# Patient Record
Sex: Male | Born: 1965 | Race: White | Hispanic: No | Marital: Single | Smoking: Former smoker
Health system: Southern US, Community
[De-identification: ages and names within clinical notes are randomized; demographics above are authoritative.]

## PROBLEM LIST (undated history)

## (undated) ENCOUNTER — Emergency Department (HOSPITAL_COMMUNITY): Payer: Self-pay

## (undated) DIAGNOSIS — T7840XA Allergy, unspecified, initial encounter: Secondary | ICD-10-CM

## (undated) DIAGNOSIS — K219 Gastro-esophageal reflux disease without esophagitis: Secondary | ICD-10-CM

## (undated) HISTORY — PX: WISDOM TOOTH EXTRACTION: SHX21

## (undated) HISTORY — DX: Gastro-esophageal reflux disease without esophagitis: K21.9

## (undated) HISTORY — DX: Allergy, unspecified, initial encounter: T78.40XA

---

## 2004-04-02 ENCOUNTER — Emergency Department (HOSPITAL_COMMUNITY): Admission: EM | Admit: 2004-04-02 | Discharge: 2004-04-02 | Payer: Self-pay | Admitting: Emergency Medicine

## 2012-04-21 DIAGNOSIS — K219 Gastro-esophageal reflux disease without esophagitis: Secondary | ICD-10-CM

## 2012-04-21 HISTORY — DX: Gastro-esophageal reflux disease without esophagitis: K21.9

## 2012-07-31 ENCOUNTER — Ambulatory Visit (INDEPENDENT_AMBULATORY_CARE_PROVIDER_SITE_OTHER): Payer: BC Managed Care – PPO | Admitting: Family Medicine

## 2012-07-31 VITALS — BP 120/75 | HR 69 | Temp 98.5°F | Resp 18 | Ht 67.0 in | Wt 183.0 lb

## 2012-07-31 DIAGNOSIS — Z113 Encounter for screening for infections with a predominantly sexual mode of transmission: Secondary | ICD-10-CM

## 2012-07-31 DIAGNOSIS — J309 Allergic rhinitis, unspecified: Secondary | ICD-10-CM

## 2012-07-31 DIAGNOSIS — Z09 Encounter for follow-up examination after completed treatment for conditions other than malignant neoplasm: Secondary | ICD-10-CM

## 2012-07-31 DIAGNOSIS — J302 Other seasonal allergic rhinitis: Secondary | ICD-10-CM

## 2012-07-31 DIAGNOSIS — K219 Gastro-esophageal reflux disease without esophagitis: Secondary | ICD-10-CM

## 2012-07-31 DIAGNOSIS — Z0000 Encounter for general adult medical examination without abnormal findings: Secondary | ICD-10-CM

## 2012-07-31 DIAGNOSIS — Z23 Encounter for immunization: Secondary | ICD-10-CM

## 2012-07-31 LAB — COMPREHENSIVE METABOLIC PANEL
ALT: 20 U/L (ref 0–53)
AST: 18 U/L (ref 0–37)
Albumin: 4.3 g/dL (ref 3.5–5.2)
Alkaline Phosphatase: 65 U/L (ref 39–117)
BUN: 17 mg/dL (ref 6–23)
CO2: 29 mEq/L (ref 19–32)
Calcium: 9.6 mg/dL (ref 8.4–10.5)
Chloride: 102 mEq/L (ref 96–112)
Creat: 0.85 mg/dL (ref 0.50–1.35)
Glucose, Bld: 95 mg/dL (ref 70–99)
Potassium: 4.5 mEq/L (ref 3.5–5.3)
Sodium: 139 mEq/L (ref 135–145)
Total Bilirubin: 0.7 mg/dL (ref 0.3–1.2)
Total Protein: 7.4 g/dL (ref 6.0–8.3)

## 2012-07-31 LAB — POCT URINALYSIS DIPSTICK
Bilirubin, UA: NEGATIVE
Blood, UA: NEGATIVE
Glucose, UA: NEGATIVE
Ketones, UA: NEGATIVE
Leukocytes, UA: NEGATIVE
Nitrite, UA: NEGATIVE
Protein, UA: NEGATIVE
Spec Grav, UA: 1.02
Urobilinogen, UA: 0.2
pH, UA: 7

## 2012-07-31 LAB — POCT CBC
Granulocyte percent: 57.7 %G (ref 37–80)
HCT, POC: 42.2 % — AB (ref 43.5–53.7)
Hemoglobin: 13.2 g/dL — AB (ref 14.1–18.1)
Lymph, poc: 1.8 (ref 0.6–3.4)
MCH, POC: 28.8 pg (ref 27–31.2)
MCHC: 31.3 g/dL — AB (ref 31.8–35.4)
MCV: 92.1 fL (ref 80–97)
MID (cbc): 0.4 (ref 0–0.9)
MPV: 8.4 fL (ref 0–99.8)
POC Granulocyte: 3 (ref 2–6.9)
POC LYMPH PERCENT: 34.6 %L (ref 10–50)
POC MID %: 7.7 %M (ref 0–12)
Platelet Count, POC: 311 10*3/uL (ref 142–424)
RBC: 4.58 M/uL — AB (ref 4.69–6.13)
RDW, POC: 14 %
WBC: 5.2 10*3/uL (ref 4.6–10.2)

## 2012-07-31 LAB — LIPID PANEL
Cholesterol: 174 mg/dL (ref 0–200)
HDL: 63 mg/dL (ref >39)
LDL Cholesterol: 98 mg/dL (ref 0–99)
Total CHOL/HDL Ratio: 2.8 Ratio
Triglycerides: 63 mg/dL (ref <150)
VLDL: 13 mg/dL (ref 0–40)

## 2012-07-31 LAB — TSH: TSH: 1.086 u[IU]/mL (ref 0.350–4.500)

## 2012-07-31 LAB — VITAMIN B12: Vitamin B-12: 614 pg/mL (ref 211–911)

## 2012-07-31 LAB — HIV ANTIBODY (ROUTINE TESTING W REFLEX): HIV: NONREACTIVE

## 2012-07-31 LAB — POCT UA - MICROSCOPIC ONLY
Bacteria, U Microscopic: NEGATIVE
Casts, Ur, LPF, POC: NEGATIVE
Crystals, Ur, HPF, POC: NEGATIVE
Mucus, UA: NEGATIVE
RBC, urine, microscopic: NEGATIVE
WBC, Ur, HPF, POC: NEGATIVE
Yeast, UA: NEGATIVE

## 2012-07-31 LAB — POCT GLYCOSYLATED HEMOGLOBIN (HGB A1C): Hemoglobin A1C: 4.9

## 2012-07-31 LAB — PSA: PSA: 1.11 ng/mL (ref <=4.00)

## 2012-07-31 NOTE — Progress Notes (Signed)
Subjective:    Patient ID: Jeremy Cruz, male    DOB: April 26, 1966, 46 y.o.   MRN: [id number]  HPIThis 46 y.o. male presents for CPE.  Last physical 2007.  Colonoscopy never.  TDAP 2007.  S/p Hepatitis B series 2007. No flu vaccines.  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  Dental exam every six months.    1.  Sinus congestion: intermittent issue for past several weeks.  No fever/chills/sweats. No headache.  No sore throat or ear pain.  +nasal congestion; +rhinorrhea; +PND.  +cough intermittent.  Taking Allegra sporadically; using nasal saline sporadically.  Performing Nettie Pot sporadically.    2.  Heartburn: has started taking Prevacid daily for past two months for heartburn in evenings.  No nausea, vomiting, diarrhea, constipation.  Did suffer with constipation on Prilosec.      Review of Systems  Constitutional: Negative for fever, chills, diaphoresis and fatigue.  HENT: Positive for congestion, rhinorrhea and postnasal drip. Negative for hearing loss, ear pain, sneezing, neck pain, neck stiffness and tinnitus.   Eyes: Negative for photophobia, pain, itching and visual disturbance.  Respiratory: Positive for cough. Negative for choking, chest tightness, shortness of breath, wheezing and stridor.   Cardiovascular: Negative for chest pain, palpitations and leg swelling.  Gastrointestinal: Negative for nausea, vomiting, abdominal pain, diarrhea, constipation and blood in stool.  Genitourinary: Negative for dysuria, frequency, flank pain, discharge, penile swelling, genital sores, penile pain and testicular pain.  Musculoskeletal: Negative for myalgias, back pain, joint swelling, arthralgias and gait problem.  Skin: Negative for color change and rash.  Neurological: Negative for seizures, syncope, facial asymmetry, speech difficulty, light-headedness, numbness and headaches.  Hematological: Negative for adenopathy. Does not bruise/bleed easily.  Psychiatric/Behavioral: Negative for suicidal  ideas, behavioral problems, disturbed wake/sleep cycle, self-injury and dysphoric mood. The patient is not nervous/anxious.     Past Medical History  Diagnosis Date  . Allergy   . GERD (gastroesophageal reflux disease) 04/21/2012    No past surgical history on file.  Prior to Admission medications   Medication Sig Start Date End Date Taking? Authorizing Provider  fexofenadine (ALLEGRA) 180 MG tablet Take 180 mg by mouth daily.   Yes Historical Provider, MD  lansoprazole (PREVACID) 15 MG capsule Take 15 mg by mouth daily.   Yes Historical Provider, MD    Allergies  Allergen Reactions  . Asa (Aspirin)   . Nsaids     History   Social History  . Marital Status: Single    Spouse Name: N/A    Number of Children: N/A  . Years of Education: N/A   Occupational History  . Not on file.   Social History Main Topics  . Smoking status: Former Games developer  . Smokeless tobacco: Not on file  . Alcohol Use: 1.2 oz/week    2 Cans of beer per week  . Drug Use: No  . Sexually Active: Yes   Other Topics Concern  . Not on file   Social History Narrative   Marital status: single; +dating seriously same sex partner x 17 years.Children: noneLiving: with same sex partner  X 17 yearsEmployment:  [employer] x 2000.Tobacco:  Former smoker; quit age 8.Alcohol:  Socially Bud Light twice per month 2 beers  Per episode.Drugs: noneExercise:  Sporadic.Sexually active: yes; total 12.  Last STD screening 2007.Seatbelt:  100% of timeGuns:  One unloaded in house.Sunscreen: yes; SPF 30.    Family History  Problem Relation Age of Onset  . Cancer Father 25  Esophageal and Prostate  . Stroke Father   . Alcohol abuse Father        Objective:   Physical Exam  Nursing note and vitals reviewed. Constitutional: He is oriented to person, place, and time. He appears well-developed and well-nourished. No distress.  HENT:  Head: Normocephalic and atraumatic.  Right Ear: External ear  normal.  Left Ear: External ear normal.  Nose: Nose normal.  Mouth/Throat: Oropharynx is clear and moist.  Eyes: Conjunctivae and EOM are normal. Pupils are equal, round, and reactive to light.  Neck: Normal range of motion. Neck supple. No thyromegaly present.  Cardiovascular: Normal rate, regular rhythm, normal heart sounds and intact distal pulses.   No murmur heard. Pulmonary/Chest: Effort normal and breath sounds normal.  Abdominal: Soft. Bowel sounds are normal. He exhibits no distension and no mass. There is no tenderness. There is no rebound and no guarding.  Genitourinary: Penis normal. No penile tenderness.  Musculoskeletal: Normal range of motion. He exhibits no edema and no tenderness.  Lymphadenopathy:    He has no cervical adenopathy.  Neurological: He is alert and oriented to person, place, and time. He has normal reflexes. No cranial nerve deficit. He exhibits normal muscle tone.  Skin: Skin is warm and dry. He is not diaphoretic.  Psychiatric: He has a normal mood and affect. His behavior is normal. Judgment and thought content normal.   Results for orders placed in visit on 07/31/12  POCT UA - MICROSCOPIC ONLY      Component Value Range   WBC, Ur, HPF, POC neg     RBC, urine, microscopic neg     Bacteria, U Microscopic neg     Mucus, UA neg     Epithelial cells, urine per micros 0-2     Crystals, Ur, HPF, POC neg     Casts, Ur, LPF, POC neg     Yeast, UA neg    POCT URINALYSIS DIPSTICK      Component Value Range   Color, UA yellow     Clarity, UA clear     Glucose, UA neg     Bilirubin, UA neg     Ketones, UA neg     Spec Grav, UA 1.020     Blood, UA neg     pH, UA 7.0     Protein, UA neg     Urobilinogen, UA 0.2     Nitrite, UA neg     Leukocytes, UA Negative    POCT CBC      Component Value Range   WBC 5.2  4.6 - 10.2 K/uL   Lymph, poc 1.8  0.6 - 3.4   POC LYMPH PERCENT 34.6  10 - 50 %L   MID (cbc) 0.4  0 - 0.9   POC MID % 7.7  0 - 12 %M   POC  Granulocyte 3.0  2 - 6.9   Granulocyte percent 57.7  37 - 80 %G   RBC 4.58 (*) 4.69 - 6.13 M/uL   Hemoglobin 13.2 (*) 14.1 - 18.1 g/dL   HCT, POC 16.1 (*) 09.6 - 53.7 %   MCV 92.1  80 - 97 fL   MCH, POC 28.8  27 - 31.2 pg   MCHC 31.3 (*) 31.8 - 35.4 g/dL   RDW, POC 04.5     Platelet Count, POC 311  142 - 424 K/uL   MPV 8.4  0 - 99.8 fL  POCT GLYCOSYLATED HEMOGLOBIN (HGB A1C)  Component Value Range   Hemoglobin A1C 4.9         Assessment & Plan:   1. Routine general medical examination at a health care facility  HIV antibody, GC/chlamydia probe amp, urine, RPR, POCT UA - Microscopic Only, POCT urinalysis dipstick, POCT CBC, POCT glycosylated hemoglobin (Hb A1C), Vitamin B12, Lipid panel, PSA, Comprehensive metabolic panel, Vitamin D, 25-hydroxy, TSH, Hepatitis A vaccine adult IM  2. Screen for STD (sexually transmitted disease)  HIV antibody, GC/chlamydia probe amp, urine, RPR, POCT UA - Microscopic Only, POCT urinalysis dipstick, Hepatitis A vaccine adult IM  3. Seasonal allergies  POCT CBC  4. GERD (gastroesophageal reflux disease)    5.  S/p Hepatitis A vaccine  1.  CPE: anticipatory guidance --- safe sex practices, seatbelt use, sunscreen use.  S/p Hepatitis A#1 per current guidelines for males with same sexual partners; RTC six months for Hepatitis A #2.  TDAP and Hepatitis B series UTD; declined flu vaccine.  Obtain labs. 2.  STD screening:  Counseled extensively during visit regarding HIV screening; obtain uriprobe, RPR, HIV. 3.  Allergic Rhinitis: worsening; restart daily Allegra 180mg  daily; continue daily nasal saline q am.  May warrant addition of nasal steroid spray if symptoms persist. 4.  GERD: New.  Continue Prevacid daily; recommend dietary modification.   5. Hepatitis A#1:  RTC six months for #2.

## 2012-07-31 NOTE — Patient Instructions (Addendum)
1. Routine general medical examination at a health care facility  HIV antibody, GC/chlamydia probe amp, urine, RPR, POCT UA - Microscopic Only, POCT urinalysis dipstick, POCT CBC, POCT glycosylated hemoglobin (Hb A1C), Vitamin B12, Lipid panel, PSA, Comprehensive metabolic panel, Vitamin D, 25-hydroxy, TSH  2. Screen for STD (sexually transmitted disease)  HIV antibody, GC/chlamydia probe amp, urine, RPR, POCT UA - Microscopic Only, POCT urinalysis dipstick  3. Seasonal allergies  POCT CBC  4. GERD (gastroesophageal reflux disease)

## 2012-08-01 LAB — VITAMIN D 25 HYDROXY (VIT D DEFICIENCY, FRACTURES): Vit D, 25-Hydroxy: 48 ng/mL (ref 30–89)

## 2012-08-01 LAB — GC/CHLAMYDIA PROBE AMP, URINE
Chlamydia, Swab/Urine, PCR: NEGATIVE
GC Probe Amp, Urine: NEGATIVE

## 2012-08-01 LAB — RPR

## 2012-08-01 NOTE — Progress Notes (Signed)
Reviewed and agree.

## 2012-08-02 NOTE — Progress Notes (Signed)
Appt scheduled with pt for 2nd Hep A 01/28/13. Jeremy Cruz

## 2012-11-19 ENCOUNTER — Ambulatory Visit (INDEPENDENT_AMBULATORY_CARE_PROVIDER_SITE_OTHER): Payer: BC Managed Care – PPO | Admitting: Physician Assistant

## 2012-11-19 VITALS — BP 136/75 | HR 89 | Temp 97.6°F | Resp 16 | Ht 68.0 in | Wt 190.2 lb

## 2012-11-19 DIAGNOSIS — M62838 Other muscle spasm: Secondary | ICD-10-CM

## 2012-11-19 MED ORDER — CYCLOBENZAPRINE HCL 5 MG PO TABS: 5.0000 mg | ORAL_TABLET | Freq: Three times a day (TID) | ORAL | Status: DC | PRN
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
## 2013-01-28 ENCOUNTER — Ambulatory Visit: Payer: BC Managed Care – PPO | Admitting: Family Medicine

## 2013-02-04 ENCOUNTER — Ambulatory Visit (INDEPENDENT_AMBULATORY_CARE_PROVIDER_SITE_OTHER): Payer: BC Managed Care – PPO | Admitting: *Deleted

## 2013-02-04 DIAGNOSIS — Z23 Encounter for immunization: Secondary | ICD-10-CM

## 2014-12-29 ENCOUNTER — Ambulatory Visit (INDEPENDENT_AMBULATORY_CARE_PROVIDER_SITE_OTHER): Payer: BLUE CROSS/BLUE SHIELD | Admitting: Family Medicine

## 2014-12-29 VITALS — BP 122/74 | HR 81 | Temp 98.0°F | Resp 17 | Ht 68.0 in | Wt 195.0 lb

## 2014-12-29 DIAGNOSIS — R439 Unspecified disturbances of smell and taste: Secondary | ICD-10-CM

## 2014-12-29 DIAGNOSIS — R431 Parosmia: Secondary | ICD-10-CM

## 2014-12-29 DIAGNOSIS — H6982 Other specified disorders of Eustachian tube, left ear: Secondary | ICD-10-CM

## 2014-12-29 DIAGNOSIS — K219 Gastro-esophageal reflux disease without esophagitis: Secondary | ICD-10-CM

## 2014-12-29 MED ORDER — FLUTICASONE PROPIONATE 50 MCG/ACT NA SUSP: 2.0000 | Freq: Every day | NASAL | Status: DC

## 2014-12-29 MED ORDER — PREDNISONE 20 MG PO TABS: ORAL_TABLET | ORAL | Status: DC

## 2014-12-29 NOTE — Patient Instructions (Addendum)
I will get you set up to see GI- they may want to do an endoscopy to check your stomach and esophagus.  In the meantime I would continue taking the nexium I suspect that your ear pain and taste changes may be related and have to do with your nose.  Try the flonase nasal spray daily.  This will take a few days to start working-in the meantime we will try prednisone by mouth for 5 days If your taste sense does not seem to come back to normal let me know
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
## 2014-12-30 LAB — COMPREHENSIVE METABOLIC PANEL
ALT: 22 U/L (ref 0–53)
AST: 19 U/L (ref 0–37)
Albumin: 4.5 g/dL (ref 3.5–5.2)
Alkaline Phosphatase: 68 U/L (ref 39–117)
BUN: 12 mg/dL (ref 6–23)
CO2: 27 mEq/L (ref 19–32)
Calcium: 9.6 mg/dL (ref 8.4–10.5)
Chloride: 101 mEq/L (ref 96–112)
Creat: 0.94 mg/dL (ref 0.50–1.35)
Glucose, Bld: 87 mg/dL (ref 70–99)
Potassium: 4.1 mEq/L (ref 3.5–5.3)
Sodium: 140 mEq/L (ref 135–145)
Total Bilirubin: 0.7 mg/dL (ref 0.2–1.2)
Total Protein: 7.3 g/dL (ref 6.0–8.3)

## 2014-12-30 LAB — CBC
HCT: 44.2 % (ref 39.0–52.0)
Hemoglobin: 15.1 g/dL (ref 13.0–17.0)
MCH: 30.4 pg (ref 26.0–34.0)
MCHC: 34.2 g/dL (ref 30.0–36.0)
MCV: 88.9 fL (ref 78.0–100.0)
MPV: 9.9 fL (ref 8.6–12.4)
Platelets: 322 10*3/uL (ref 150–400)
RBC: 4.97 MIL/uL (ref 4.22–5.81)
RDW: 13.8 % (ref 11.5–15.5)
WBC: 6.6 10*3/uL (ref 4.0–10.5)

## 2014-12-31 ENCOUNTER — Encounter: Payer: Self-pay | Admitting: Family Medicine

## 2016-09-19 ENCOUNTER — Ambulatory Visit (INDEPENDENT_AMBULATORY_CARE_PROVIDER_SITE_OTHER): Payer: Worker's Compensation | Admitting: Physician Assistant

## 2016-09-19 ENCOUNTER — Ambulatory Visit (INDEPENDENT_AMBULATORY_CARE_PROVIDER_SITE_OTHER): Payer: Worker's Compensation

## 2016-09-19 VITALS — BP 128/80 | HR 80 | Temp 97.9°F | Resp 17 | Ht 68.0 in | Wt 189.0 lb

## 2016-09-19 DIAGNOSIS — S61431A Puncture wound without foreign body of right hand, initial encounter: Secondary | ICD-10-CM | POA: Diagnosis not present

## 2016-09-19 DIAGNOSIS — Z23 Encounter for immunization: Secondary | ICD-10-CM | POA: Diagnosis not present

## 2016-09-19 MED ORDER — CEPHALEXIN 250 MG PO CAPS: 500.0000 mg | ORAL_CAPSULE | Freq: Four times a day (QID) | ORAL | 0 refills | Status: DC

## 2016-09-19 NOTE — Progress Notes (Signed)
Norman HerrlichSteven Heal 02-22-1966 50 y.o.   Chief Complaint  Patient presents with  . Hand Injury    Right. Bamboo went into hand upon fall      Date of Injury: 09/19/2016, approximately 5 pm  History of Present Illness:  Presents for evaluation of work-related complaint. At work just prior to arrival, he slipped and landed with his outstretched hand on some plant materials (reed-like), and one punctured the palm of the RIGHT hand.  He is RIGHT hand dominant. Unknown last tetanus vaccine.  He estimates that the reed went in his hand approximately 0.5 cm.   Review of Systems  Constitutional: Negative for chills and fever.  Eyes: Negative for blurred vision, double vision and photophobia.  Respiratory: Negative for cough and shortness of breath.   Cardiovascular: Negative for chest pain and palpitations.  Gastrointestinal: Positive for nausea (assocaited with the pain).  Skin: Negative for itching and rash.  Neurological: Positive for dizziness (associated with the pain) and tingling (in the RIGHT index finger since the injury). Negative for sensory change, focal weakness and headaches.       Current medications and allergies reviewed and updated. Past medical history, family history, social history have been reviewed and updated.   Physical Exam  Constitutional: He is oriented to person, place, and time and well-developed, well-nourished, and in no distress.  BP 128/80 (BP Location: Left Arm, Patient Position: Sitting, Cuff Size: Large)   Pulse 80   Temp 97.9 F (36.6 C) (Oral)   Resp 17   Ht 5\' 8"  (1.727 m)   Wt 189 lb (85.7 kg)   SpO2 98%   BMI 28.74 kg/m    Eyes: Conjunctivae are normal.  Pulmonary/Chest: Effort normal.  Musculoskeletal:       Right hand: He exhibits tenderness (at the wound) and laceration. He exhibits normal range of motion, no bony tenderness, normal two-point discrimination, normal capillary refill, no deformity and no swelling. Normal  sensation noted. Normal strength noted.       Hands: Neurological: He is alert and oriented to person, place, and time. Gait normal.  Skin: Skin is warm and dry.  Psychiatric: Memory, affect and judgment normal. His mood appears anxious. He does not exhibit a depressed mood. He expresses no homicidal and no suicidal ideation. He expresses no suicidal plans and no homicidal plans.     Dg Hand Complete Right  Result Date: 09/19/2016 CLINICAL DATA:  Puncture wound to the right hand by plant stalk. Assess for foreign body. Initial encounter. EXAM: RIGHT HAND - COMPLETE 3+ VIEW COMPARISON:  None. FINDINGS: There is no evidence of fracture or dislocation. The joint spaces are preserved. The carpal rows are intact, and demonstrate normal alignment. The known puncture wound is not well characterized. No radiopaque foreign bodies are seen. Note that a plant stalk is unlikely to be visible on radiograph. Mild ulnar soft tissue swelling is noted. IMPRESSION: No evidence of fracture or dislocation. No radiopaque foreign bodies seen. Electronically Signed   By: Roanna RaiderJeffery  Chang M.D.   On: 09/19/2016 18:25     Assessment and Plan:  1. Puncture wound of right hand, foreign body presence unspecified, initial encounter Cover for infection given mechanism of wound, and not sutured. Local wound care. Minimal use RIGHT hand. Re-evaluate in 1 week. - DG Hand Complete Right; Future - cephALEXin (KEFLEX) 250 MG capsule; Take 2 capsules (500 mg total) by mouth 4 (four) times daily.  Dispense: 28 capsule; Refill: 0  2. Need for Tdap vaccination -  Tdap vaccine greater than or equal to 7yo IM   Fernande Brashelle S. Jonavin Seder, PA-C Physician Assistant-Certified [facility]

## 2016-09-19 NOTE — Patient Instructions (Addendum)
Wash the wound at least once each day with soap and water. Keep it covered while you are working until healed. If you have any persistent symptoms, return for re-evaluation.    IF you received an x-ray today, you will receive an invoice from [facility]. Please contact [facility] at [phone] with questions or concerns regarding your invoice.   IF you received labwork today, you will receive an invoice from [facility]. Please contact [facility] at [phone] with questions or concerns regarding your invoice.   Our billing staff will not be able to assist you with questions regarding bills from these companies.  You will be contacted with the lab results as soon as they are available. The fastest way to get your results is to activate your My Chart account. Instructions are located on the last page of this paperwork. If you have not heard from us regarding the results in 2 weeks, please contact this office.

## 2016-09-19 NOTE — Progress Notes (Signed)
Jeremy HerrlichSteven Cruz 08-26-1966 50 y.o.       Chief Complaint  Patient presents with  . Hand Injury    Right. Bamboo went into hand upon fall      Date of Injury: 09/19/2016, approximately 5 pm  History of Present Illness:  Presents for evaluation of work-related complaint. 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 States he believes about ~1cm (he noted a fingernail's length) of the plant went through his hand in the center of his palm. Notes immediate pain and bleeding at the site and says he has had some associated tingling and a numbness sensation in his right index finger. States he pull the plant stalk out immediately. Presents with hand in a fist over injury site. Denies loss of motor function of fingers. Notes some nausea and lightheadedness from looking at the injury site but denies vomiting.    ROS Pertinent ROS mentioned above in HPI.  Current medications and allergies reviewed and updated. Past medical history, family history, social history have been reviewed and updated.   Physical Exam  Musculoskeletal:       Right wrist: He exhibits normal range of motion, no tenderness, no bony tenderness, no swelling, no effusion, no crepitus and no deformity.       Left wrist: He exhibits normal range of motion, no tenderness, no bony tenderness, no swelling, no effusion, no crepitus and no deformity.       Right hand: He exhibits no tenderness, normal capillary refill, no deformity and no swelling. Normal sensation noted. Decreased sensation is not present in the ulnar distribution, is not present in the medial distribution and is not present in the radial distribution.       Left hand: He exhibits normal range of motion, no tenderness, normal capillary refill, no deformity, no laceration and no swelling. Normal sensation noted. Normal strength noted.       Hands: Decreased active ROM in right hand due to  pain from puncture wound. Able to perform passive ROM of all fingers and sensation present.     Dg Hand Complete Right  Result Date: 09/19/2016 CLINICAL DATA:  Puncture wound to the right hand by plant stalk. Assess for foreign body. Initial encounter. EXAM: RIGHT HAND - COMPLETE 3+ VIEW COMPARISON:  None. FINDINGS: There is no evidence of fracture or dislocation. The joint spaces are preserved. The carpal rows are intact, and demonstrate normal alignment. The known puncture wound is not well characterized. No radiopaque foreign bodies are seen. Note that a plant stalk is unlikely to be visible on radiograph. Mild ulnar soft tissue swelling is noted. IMPRESSION: No evidence of fracture or dislocation. No radiopaque foreign bodies seen. Electronically Signed   By: Roanna RaiderJeffery  Chang M.D.   On: 09/19/2016 18:25   1% Lidocaine HCl administered to puncture wound site. Wound cleansed with warm soapy water and appropriately dressed and bandaged with coban wrap.   Assessment and Plan: 1. Puncture wound of right hand, foreign body presence unspecified, initial encounter Right hand x-ray with no evidence of fracture or dislocation, no foreign bodies seen. Wound site cleansed and dressed in clinic. Tdap administered. Advised patient to wash the wound daily with soap and water and keep it covered while it is healing. Rx Keflex to prevent bacterial infection from puncture wound. Instructed to RTC if persistent symptoms. - DG Hand Complete Right; Future - cephALEXin (KEFLEX) 250 MG capsule; Take 2 capsules (500 mg total) by mouth  4 (four) times daily.  Dispense: 28 capsule; Refill: 0  2. Need for Tdap vaccination Overdue for Tdap vaccine and puncture wound today. Tdap given in clinic.  - Tdap vaccine greater than or equal to 7yo IM

## 2016-09-26 ENCOUNTER — Ambulatory Visit (INDEPENDENT_AMBULATORY_CARE_PROVIDER_SITE_OTHER): Payer: Worker's Compensation | Admitting: Physician Assistant

## 2016-09-26 VITALS — BP 110/80 | HR 76 | Temp 97.5°F | Ht 68.0 in | Wt 188.0 lb

## 2016-09-26 DIAGNOSIS — S61431A Puncture wound without foreign body of right hand, initial encounter: Secondary | ICD-10-CM

## 2016-09-26 DIAGNOSIS — R202 Paresthesia of skin: Secondary | ICD-10-CM | POA: Diagnosis not present

## 2016-09-26 DIAGNOSIS — L03113 Cellulitis of right upper limb: Secondary | ICD-10-CM

## 2016-09-26 DIAGNOSIS — S61431D Puncture wound without foreign body of right hand, subsequent encounter: Secondary | ICD-10-CM | POA: Diagnosis not present

## 2016-09-26 MED ORDER — DOXYCYCLINE HYCLATE 100 MG PO CAPS: 100.0000 mg | ORAL_CAPSULE | Freq: Two times a day (BID) | ORAL | 0 refills | Status: AC

## 2016-09-26 MED ORDER — DOXYCYCLINE HYCLATE 100 MG PO CAPS: 100.0000 mg | ORAL_CAPSULE | Freq: Two times a day (BID) | ORAL | 0 refills | Status: DC

## 2016-09-26 NOTE — Patient Instructions (Addendum)
Continue washing the wound daily with soap and water. Start the new antibiotic. Apply a triple antibiotic ointment (Neosporin, polysporin) and apply a bandaid when you are doing anything where it could become contaminated. Wash at least daily with soap and water.     IF you received an x-ray today, you will receive an invoice from [facility]. Please contact [facility] at [phone] with questions or concerns regarding your invoice.   IF you received labwork today, you will receive an invoice from [facility]. Please contact [facility] at [phone] with questions or concerns regarding your invoice.   Our billing staff will not be able to assist you with questions regarding bills from these companies.  You will be contacted with the lab results as soon as they are available. The fastest way to get your results is to activate your My Chart account. Instructions are located on the last page of this paperwork. If you have not heard from us regarding the results in 2 weeks, please contact this office.

## 2016-09-26 NOTE — Progress Notes (Signed)
Norman HerrlichSteven Turck Apr 13, 1966 50 y.o.   Chief Complaint  Patient presents with  . Follow-up    rt. hand wound      Date of Injury: 09/19/2016  History of Present Illness:  Presents for evaluation of work-related complaint.  Seen initially on 09/19/2016 following an injury to the RIGHT hand when he accidentally fell at work, landing on his outstretched hand in some reeds. A plant stalk punctured the palm of the RIGHT hand. He reported some tingling in the hand and fingers, thought likely due to swelling and that he had kept his hand in a tight fist since the injury. The wound area was anesthetized and scrubbed with soap and water, rinsed and explored. No FB was seen in the wound, and radiographs were negative. The wound was not sutured, given the puncture nature, and he was started on cephalexin prophylactically. Tdap administered, as he was overdue for immunization.  He has not yet returned to work, due to pain. He has been applying A&D ointment to the wound and relates that a piece of the plant material ("husk of the outer layer") came out of the wound and the he feels like there is more there.  The numbness/tingling sensation is worse, now extending up the arm. He has increased swelling of the hand, and had lost some ROM. Increased redness. Sensitivity to heat initially, but that has resolved.       No drainage, red streaking, fever, chills. Some nausea, no vomiting. Tolerating the cephalexin.   ROS  As above.   Current medications and allergies reviewed and updated. Past medical history, family history, social history have been reviewed and updated.   Physical Exam  Constitutional: He is oriented to person, place, and time and well-developed, well-nourished, and in no distress. No distress.  BP 110/80 (BP Location: Left Arm, Patient Position: Sitting, Cuff Size: Normal)   Pulse 76   Temp 97.5 F (36.4 C) (Oral)   Ht 5\' 8"  (1.727 m)   Wt 188 lb (85.3 kg)   SpO2 99%   BMI  28.59 kg/m    Eyes: Conjunctivae are normal.  Pulmonary/Chest: Effort normal.  Musculoskeletal:       Right hand: He exhibits decreased range of motion, tenderness, laceration and swelling. He exhibits no bony tenderness and normal capillary refill. Decreased sensation: "tingling" Decreased strength (due to pain) noted.       Hands: Neurological: He is alert and oriented to person, place, and time. Gait normal.  Skin: Skin is warm and dry. He is not diaphoretic.  Psychiatric: Mood, memory, affect and judgment normal.     Assessment and Plan: 1. Puncture wound of right hand, foreign body presence unspecified, initial encounter 2. Right hand paresthesia 3. Cellulitis of right upper extremity Add doxycycline. Wash daily with soap and water. He may RTW, but no use RIGHT hand. Cover wound at work. Refer to Hand Surgery, may need exploration for possible retained FB. - Ambulatory referral to Hand Surgery - doxycycline (VIBRAMYCIN) 100 MG capsule; Take 1 capsule (100 mg total) by mouth 2 (two) times daily.  Dispense: 20 capsule; Refill: 0    Fernande Brashelle S. Reynoldo Mainer, PA-C Physician Assistant-Certified [facility]

## 2016-09-26 NOTE — Progress Notes (Signed)
Subjective:    Patient ID: Jeremy Cruz, male    DOB: Jan 24, 1966, 50 y.o.   MRN: [id number]   Chief Complaint  Patient presents with  . Follow-up    rt. hand wound     HPI: Patient presents for follow-up of puncture wound injury which occurred 1 week ago. 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 [facility] and wound was cleansed and x-ray performed which did not show evidence of radioopaque foreign body. Patient states he has not returned to work since the injury. Notes that he put A&D ointment on the site and that the day after the injury some more of the "husk of the outer layer of the plant" worked its way out of the wound site. States his right index finger is still "numb" and tingling and he has had continued radiation of numbness and tingling up his right arm. Also notes decreased ROM in right hand and being unable to do certain tasks such as putting his thumb to his other fingers or make a fist with his hand. He states he has has continued swelling and erythema surrounding the wound but no drainage at the puncture wound area. Notes the swelling and erythema have increased some the past few days which have contributed to his decreased ROM. Notes some sensitivity to heat or "feeling like my arm was burning when hot water touched it" the day after the injury but has not had that same sensation since then. Notes some associated nausea with his symptoms but no fevers, chills, or vomiting.  Allergies  Allergen Reactions  . Asa [Aspirin] Anaphylaxis  . Nsaids Anaphylaxis   Prior to Admission medications   Medication Sig Start Date End Date Taking? Authorizing Provider  fexofenadine (ALLEGRA) 180 MG tablet Take 180 mg by mouth daily.   Yes Historical Provider, MD  lansoprazole (PREVACID) 15 MG capsule Take 15 mg by mouth daily.   Yes Historical Provider, MD  doxycycline (VIBRAMYCIN) 100 MG capsule Take 1  capsule (100 mg total) by mouth 2 (two) times daily. 09/26/16 10/06/16  Porfirio Oarhelle Jeffery, PA-C   There are no active problems to display for this patient.   Review of Systems Pertinent ROS mentioned above in HPI.     Objective:   Physical Exam  Constitutional: He is oriented to person, place, and time. He appears well-developed and well-nourished.  HENT:  Head: Normocephalic and atraumatic.  Cardiovascular:  Pulses:      Radial pulses are 2+ on the right side, and 2+ on the left side.  Musculoskeletal:       Right elbow: He exhibits normal range of motion, no swelling, no deformity and no laceration. No tenderness found.       Left elbow: He exhibits normal range of motion, no swelling, no deformity and no laceration. No tenderness found.       Right wrist: He exhibits normal range of motion, no tenderness, no bony tenderness, no swelling and no deformity.       Left wrist: He exhibits normal range of motion, no tenderness, no bony tenderness, no swelling and no deformity.       Right hand: He exhibits decreased range of motion, tenderness, laceration and swelling. He exhibits no bony tenderness, normal capillary refill and no deformity. Normal sensation noted. Decreased sensation is not present in the ulnar distribution, is not present in the medial distribution and is not present  in the radial distribution. He exhibits no finger abduction, no thumb/finger opposition and no wrist extension trouble.       Left hand: He exhibits normal range of motion, no tenderness, no bony tenderness, normal capillary refill, no deformity, no laceration and no swelling. Normal sensation noted. Normal strength noted.       Hands: Neurological: He is alert and oriented to person, place, and time.  Reflex Scores:      Bicep reflexes are 2+ on the right side and 2+ on the left side. Psychiatric: He has a normal mood and affect. His behavior is normal.          Assessment & Plan:  1. Puncture wound of  right hand, foreign body presence unspecified, initial encounter Continued pain, paresthesias, swelling, and erythema in hand warrants change of ABX to Doxycycline 100 mg BID x 10 days and referral to hand surgery. - Ambulatory referral to Hand Surgery - doxycycline (VIBRAMYCIN) 100 MG capsule; Take 1 capsule (100 mg total) by mouth 2 (two) times daily.  Dispense: 20 capsule; Refill: 0  2. Right hand paresthesia Referral to hand surgery.  3. Cellulitis of right upper extremity Previously on Keflex but hand still appears infected, switched ABX to Doxycycline. See above.

## 2016-09-29 ENCOUNTER — Ambulatory Visit (INDEPENDENT_AMBULATORY_CARE_PROVIDER_SITE_OTHER): Payer: Worker's Compensation | Admitting: Orthopaedic Surgery

## 2016-09-29 ENCOUNTER — Encounter (INDEPENDENT_AMBULATORY_CARE_PROVIDER_SITE_OTHER): Payer: Self-pay | Admitting: Orthopaedic Surgery

## 2016-09-29 DIAGNOSIS — S61411A Laceration without foreign body of right hand, initial encounter: Secondary | ICD-10-CM

## 2016-09-29 NOTE — Progress Notes (Signed)
Office Visit Note   Patient: Jeremy HerrlichSteven Cruz           Date of Birth: 03-Mar-1966           MRN: [id number] Visit Date: 09/29/2016              Requested by: Porfirio Oarhelle Jeffery, PA-C [address] [place], [place] [address] PCP: Nilda SimmerSMITH,KRISTI, MD   Assessment & Plan: Visit Diagnoses: No diagnosis found.  Plan: I discussed with the patient that the hand exam appears benign. He is experiencing expected swelling and scar tissue from the glabrous skin. He does use his hand quite a bit at work and therefore I think he should do light duty for 3 more weeks. I'll plan on seeing her back for recheck in 3 weeks. Questions encouraged and answered.  Follow-Up Instructions: Return in about 3 weeks (around 10/20/2016) for recheck right hand.   Orders:  No orders of the defined types were placed in this encounter.  No orders of the defined types were placed in this encounter.     Procedures: No procedures performed   Clinical Data: No additional findings.   Subjective: Chief Complaint  Patient presents with  . Right Hand - Pain, Injury    DOI: 09/19/16    Patient is a 50 year old gentleman who sustained a traumatic puncture of his right thenar eminence at work about 2 weeks ago. A bamboo shoe was dramatically inserted into his right hand. He did go to the urgent care and x-rays were negative. He endorses paresthesias in his right index finger. He feels a bump where the traumatic laceration occurred. This is to his dominant hand. He's been soaking it in Epsom salt. He did finish a course of doxycycline. He denies any fevers or drainage or chills. The pain does not radiate. The pain overall has gotten better. He does have a sensation of foreign body.      Review of Systems  Constitutional: Negative.   HENT: Negative.   Eyes: Negative.   Respiratory: Negative.   Cardiovascular: Negative.   Gastrointestinal: Negative.   Endocrine: Negative.   Genitourinary: Negative.   Musculoskeletal:  Negative.   Skin: Negative.   Allergic/Immunologic: Negative.   Neurological: Negative.   Hematological: Negative.   Psychiatric/Behavioral: Negative.      Objective: Vital Signs: There were no vitals taken for this visit.  Physical Exam  Constitutional: He is oriented to person, place, and time. He appears well-developed and well-nourished.  HENT:  Head: Normocephalic and atraumatic.  Eyes: EOM are normal.  Neck: Neck supple.  Cardiovascular: Intact distal pulses.   Pulmonary/Chest: Effort normal.  Abdominal: Soft.  Musculoskeletal:       Left knee: He exhibits no effusion.  Neurological: He is alert and oriented to person, place, and time.  Skin: Skin is warm.  Psychiatric: He has a normal mood and affect. His behavior is normal. Judgment and thought content normal.  Nursing note and vitals reviewed.   Left Knee Exam   Other  Effusion: no effusion present   Right Hand Exam   Comments:  His flexor and extensor functions are all intact. There is no signs of infection. He does have swelling in the traumatic region this is tender to palpation. I do not appreciate any evidence of retained foreign body. The traumatic wound has healed up nicely. There is no drainage. He is neurovascularly intact to the thumb and the rest of the hand.      Specialty Comments:  No specialty comments  available.  Imaging: No results found. X-rays of the hand were independently reviewed and interpreted as negative for foreign body and fracture.  PMFS History: There are no active problems to display for this patient.  Past Medical History:  Diagnosis Date  . Allergy   . GERD (gastroesophageal reflux disease) 04/21/2012    Family History  Problem Relation Age of Onset  . Cancer Father 6664    Esophageal and Prostate  . Stroke Father   . Alcohol abuse Father     History reviewed. No pertinent surgical history. Social History   Occupational History  . Not on file.   Social History  Main Topics  . Smoking status: Former Games developermoker  . Smokeless tobacco: Never Used  . Alcohol use 1.2 oz/week    2 Cans of beer per week  . Drug use: No  . Sexual activity: Yes

## 2016-10-10 ENCOUNTER — Encounter (INDEPENDENT_AMBULATORY_CARE_PROVIDER_SITE_OTHER): Payer: Self-pay | Admitting: Orthopaedic Surgery

## 2016-10-10 ENCOUNTER — Ambulatory Visit (INDEPENDENT_AMBULATORY_CARE_PROVIDER_SITE_OTHER): Payer: Worker's Compensation | Admitting: Orthopaedic Surgery

## 2016-10-10 DIAGNOSIS — S61411A Laceration without foreign body of right hand, initial encounter: Secondary | ICD-10-CM

## 2016-10-10 NOTE — Progress Notes (Signed)
Office Visit Note   Patient: Jeremy HerrlichSteven Rao           Date of Birth: 1966-08-10           MRN: [id number] Visit Date: 10/10/2016              Requested by: Ethelda ChickKristi M Smith, MD [address] [place], [place] [address] PCP: Nilda SimmerSMITH,KRISTI, MD   Assessment & Plan: Visit Diagnoses:  1. Laceration without foreign body of right hand, initial encounter     Plan: In terms of the traumatic laceration he's doing quite well. I think that he may have injured the common digital nerve to the index finger given his presentation. At this point I would like to refer him to [facility] for further treatment. Total face to face encounter time was greater than 25 minutes and over half of this time was spent in counseling and/or coordination of care.  Follow-Up Instructions: Return if symptoms worsen or fail to improve.   Orders:  Orders Placed This Encounter  Procedures  . Ambulatory referral to Orthopedic Surgery   No orders of the defined types were placed in this encounter.     Procedures: No procedures performed   Clinical Data: No additional findings.   Subjective: Chief Complaint  Patient presents with  . Right Hand - Pain    Patient follows up today for his right hand. He is doing much better. He states that he was able to pull out 3 bamboo splinters from the hand. He's able to move his hands much better. He still endorses dense numbness on the ulnar aspect of his index finger. Denies any fevers or chills.    Review of Systems  Constitutional: Negative.   HENT: Negative.   Eyes: Negative.   Respiratory: Negative.   Cardiovascular: Negative.   Gastrointestinal: Negative.   Endocrine: Negative.   Genitourinary: Negative.   Musculoskeletal: Negative.   Skin: Negative.   Allergic/Immunologic: Negative.   Neurological: Negative.   Hematological: Negative.   Psychiatric/Behavioral: Negative.      Objective: Vital Signs: There were no vitals  taken for this visit.  Physical Exam  Constitutional: He is oriented to person, place, and time. He appears well-developed and well-nourished.  HENT:  Head: Normocephalic and atraumatic.  Eyes: EOM are normal.  Neck: Neck supple.  Cardiovascular: Intact distal pulses.   Pulmonary/Chest: Effort normal.  Abdominal: Soft.  Neurological: He is alert and oriented to person, place, and time.  Skin: Skin is warm.  Psychiatric: He has a normal mood and affect. His behavior is normal. Judgment and thought content normal.  Nursing note and vitals reviewed.   Ortho Exam Exam of the right hand shows greatly improved traumatic laceration. The swelling is essentially fully resolved. There is no drainage and no signs of infection. He endorses dense numbness in the ulnar aspect of his index finger. He does have flexor strength with index finger. Specialty Comments:  No specialty comments available.  Imaging: No results found.   PMFS History: Patient Active Problem List   Diagnosis Date Noted  . Laceration without foreign body of right hand, initial encounter 09/29/2016   Past Medical History:  Diagnosis Date  . Allergy   . GERD (gastroesophageal reflux disease) 04/21/2012    Family History  Problem Relation Age of Onset  . Cancer Father 1464    Esophageal and Prostate  . Stroke Father   . Alcohol abuse Father     No past surgical history on  file. Social History   Occupational History  . Not on file.   Social History Main Topics  . Smoking status: Former Games developermoker  . Smokeless tobacco: Never Used  . Alcohol use 1.2 oz/week    2 Cans of beer per week  . Drug use: No  . Sexual activity: Yes

## 2016-10-20 ENCOUNTER — Ambulatory Visit (INDEPENDENT_AMBULATORY_CARE_PROVIDER_SITE_OTHER): Payer: Self-pay | Admitting: Orthopaedic Surgery

## 2016-11-25 ENCOUNTER — Ambulatory Visit (INDEPENDENT_AMBULATORY_CARE_PROVIDER_SITE_OTHER): Payer: Worker's Compensation | Admitting: Orthopaedic Surgery

## 2016-11-25 ENCOUNTER — Encounter (INDEPENDENT_AMBULATORY_CARE_PROVIDER_SITE_OTHER): Payer: Self-pay | Admitting: Orthopaedic Surgery

## 2016-11-25 DIAGNOSIS — S61411A Laceration without foreign body of right hand, initial encounter: Secondary | ICD-10-CM

## 2016-11-25 NOTE — Progress Notes (Signed)
   Office Visit Note   Patient: Jeremy HerrlichSteven Cruz           Date of Birth: [birth year]           MRN: [id number] Visit Date: 11/25/2016              Requested by: Ethelda ChickKristi M Smith, MD [address] [place], [place] [address] PCP: Nilda SimmerSMITH,KRISTI, MD   Assessment & Plan: Visit Diagnoses:  1. Laceration without foreign body of right hand, initial encounter     Plan: Given the location of his traumatic wound and foreign body he may have contused the common digital nerve and the flexor tendon. I think the tendons and nerves are in continuity but the patient insists that he thinks something is wrong inside. I feel that this is normal for a traumatic laceration with a foreign body in that location of the hand. Nevertheless patient would like to seek a second opinion therefore I have referred him to [facility].  Follow-Up Instructions: Return if symptoms worsen or fail to improve.   Orders:  No orders of the defined types were placed in this encounter.  No orders of the defined types were placed in this encounter.     Procedures: No procedures performed   Clinical Data: No additional findings.   Subjective: Chief Complaint  Patient presents with  . Right Hand - Laceration    Patient follows up today for continued right index finger numbness. He states that he continues to have hypersensitivity to touch and vibration.      Review of Systems   Objective: Vital Signs: There were no vitals taken for this visit.  Physical Exam  Ortho Exam Exam of the right hand shows well-healed traumatic scar at the thenar crease. He has paresthesias and hypersensitivity to light touch on the index finger. His motor function of the index finger is normal. The rest of the hand exam is benign. Specialty Comments:  No specialty comments available.  Imaging: No results found.   PMFS History: Patient Active Problem List   Diagnosis Date Noted  . Laceration without  foreign body of right hand, initial encounter 09/29/2016   Past Medical History:  Diagnosis Date  . Allergy   . GERD (gastroesophageal reflux disease) 04/21/2012    Family History  Problem Relation Age of Onset  . Cancer Father 6664    Esophageal and Prostate  . Stroke Father   . Alcohol abuse Father     No past surgical history on file. Social History   Occupational History  . Not on file.   Social History Main Topics  . Smoking status: Former Games developermoker  . Smokeless tobacco: Never Used  . Alcohol use 1.2 oz/week    2 Cans of beer per week  . Drug use: No  . Sexual activity: Yes

## 2017-02-02 ENCOUNTER — Ambulatory Visit (INDEPENDENT_AMBULATORY_CARE_PROVIDER_SITE_OTHER): Payer: BLUE CROSS/BLUE SHIELD | Admitting: Family Medicine

## 2017-02-02 VITALS — BP 110/80 | HR 73 | Temp 97.6°F | Resp 18 | Ht 68.0 in | Wt 176.0 lb

## 2017-02-02 DIAGNOSIS — R6882 Decreased libido: Secondary | ICD-10-CM | POA: Diagnosis not present

## 2017-02-02 DIAGNOSIS — R350 Frequency of micturition: Secondary | ICD-10-CM

## 2017-02-02 DIAGNOSIS — Z7251 High risk heterosexual behavior: Secondary | ICD-10-CM | POA: Diagnosis not present

## 2017-02-02 DIAGNOSIS — R5383 Other fatigue: Secondary | ICD-10-CM | POA: Diagnosis not present

## 2017-02-02 DIAGNOSIS — R300 Dysuria: Secondary | ICD-10-CM

## 2017-02-02 LAB — POCT URINALYSIS DIP (MANUAL ENTRY)
Bilirubin, UA: NEGATIVE
Blood, UA: NEGATIVE
Glucose, UA: NEGATIVE
Nitrite, UA: NEGATIVE
Protein Ur, POC: NEGATIVE
Spec Grav, UA: 1.005
Urobilinogen, UA: 0.2
pH, UA: 5.5

## 2017-02-02 LAB — POC MICROSCOPIC URINALYSIS (UMFC): Mucus: ABSENT

## 2017-02-02 MED ORDER — DOXYCYCLINE HYCLATE 100 MG PO CAPS: 100.0000 mg | ORAL_CAPSULE | Freq: Two times a day (BID) | ORAL | 1 refills | Status: DC

## 2017-02-02 MED ORDER — CEFTRIAXONE SODIUM 1 G IJ SOLR
1.0000 g | Freq: Once | INTRAMUSCULAR | Status: AC
  Administered 2017-02-02: 1 g via INTRAMUSCULAR

## 2017-02-02 NOTE — Progress Notes (Signed)
Subjective:    Patient ID: Jeremy Cruz, male    DOB: 06/26/1966, 51 y.o.   MRN: [id number]  02/02/2017  Urinary Frequency and Dysuria   HPI This 51 y.o. male presents for evaluation of fatigue and erectile dysfunction and no sex drive.  Onset not sure.  Had UTI years ago; treated with Doxy.  Feels similar but not.   Father with prostate cancer at age 90. No smoking or drinking.  No fever; last month was sick; not the flu; bought emergency.  Felt better.  Frequent urination has been coming along a while.  Decreased urinary stream.  Dysuria.  +frequency.  Drinks coffee in morning.  Has started diet regimen.  No hematuria.  Drinking tons and tons of water; feeling better.   No hematuria.  No penile discharge. Itches inside urethra.  No nocturia x 1-2; normal x 0.   No sexual activity in months. Past twenty years, two partners. Last sexual partner x 3 months ago.   Grew up in [place].  Dates males.  Immunization History  Administered Date(s) Administered  . Hepatitis A 07/31/2012, 02/04/2013  . Tdap 09/19/2016   BP Readings from Last 3 Encounters:  02/02/17 110/80  09/26/16 110/80  09/19/16 128/80   Wt Readings from Last 3 Encounters:  02/02/17 176 lb (79.8 kg)  09/26/16 188 lb (85.3 kg)  09/19/16 189 lb (85.7 kg)    Review of Systems  Constitutional: Positive for fatigue. Negative for activity change, appetite change, chills, diaphoresis and fever.  Eyes: Negative for visual disturbance.  Respiratory: Negative for cough and shortness of breath.   Cardiovascular: Negative for chest pain, palpitations and leg swelling.  Gastrointestinal: Negative for abdominal distention, abdominal pain, anal bleeding, blood in stool, constipation, diarrhea, nausea and rectal pain.  Endocrine: Negative for cold intolerance, heat intolerance, polydipsia, polyphagia and polyuria.  Genitourinary: Positive for decreased urine volume, difficulty urinating, frequency and urgency. Negative for discharge,  dysuria, enuresis, flank pain, genital sores, hematuria, penile pain, penile swelling, scrotal swelling and testicular pain.  Neurological: Negative for dizziness, tremors, seizures, syncope, facial asymmetry, speech difficulty, weakness, light-headedness, numbness and headaches.    Past Medical History:  Diagnosis Date  . Allergy   . GERD (gastroesophageal reflux disease) 04/21/2012   No past surgical history on file. Allergies  Allergen Reactions  . Asa [Aspirin] Anaphylaxis  . Nsaids Anaphylaxis    Social History   Social History  . Marital status: Single    Spouse name: N/A  . Number of children: N/A  . Years of education: N/A   Occupational History  . Not on file.   Social History Main Topics  . Smoking status: Former Games developer  . Smokeless tobacco: Never Used  . Alcohol use 1.2 oz/week    2 Cans of beer per week  . Drug use: No  . Sexual activity: Yes   Other Topics Concern  . Not on file   Social History Narrative   Marital status: single; +dating seriously same sex partner x 17 years.   Children: none   Living: with same sex partner  X 17 years   Employment:  [employer] x 2000.   Tobacco:  Former smoker; quit age 37.   Alcohol:  Socially Bud Light twice per month 2 beers  Per episode.   Drugs: none   Exercise:  Sporadic.   Sexually active: yes; total 12.  Last STD screening 2007.   Seatbelt:  100% of time   Guns:  One unloaded in house.   Sunscreen: yes; SPF 30.   Family History  Problem Relation Age of Onset  . Cancer Father [id number]    Esophageal and Prostate  . Stroke Father   . Alcohol abuse Father        Objective:    BP 110/80 (BP Location: Left Arm, Cuff Size: Normal)   Pulse 73   Temp 97.6 F (36.4 C) (Oral)   Resp 18   Ht 5\' 8"  (1.727 m)   Wt 176 lb (79.8 kg)   SpO2 97%   BMI 26.76 kg/m  Physical Exam  Constitutional: He is oriented to person, place, and time. He appears well-developed and well-nourished. No  distress.  HENT:  Head: Normocephalic and atraumatic.  Right Ear: External ear normal.  Left Ear: External ear normal.  Nose: Nose normal.  Mouth/Throat: Oropharynx is clear and moist.  Eyes: Conjunctivae and EOM are normal. Pupils are equal, round, and reactive to light.  Neck: Normal range of motion. Neck supple. Carotid bruit is not present. No thyromegaly present.  Cardiovascular: Normal rate, regular rhythm, normal heart sounds and intact distal pulses.  Exam reveals no gallop and no friction rub.   No murmur heard. Pulmonary/Chest: Effort normal and breath sounds normal. He has no wheezes. He has no rales.  Abdominal: Soft. Bowel sounds are normal. He exhibits no distension and no mass. There is no tenderness. There is no rebound and no guarding. Hernia confirmed negative in the right inguinal area and confirmed negative in the left inguinal area.  Genitourinary: Testes normal and penis normal.  Lymphadenopathy:    He has no cervical adenopathy.       Right: No inguinal adenopathy present.       Left: No inguinal adenopathy present.  Neurological: He is alert and oriented to person, place, and time. No cranial nerve deficit.  Skin: Skin is warm and dry. No rash noted. He is not diaphoretic.  Psychiatric: He has a normal mood and affect. His behavior is normal.  Nursing note and vitals reviewed.  Results for orders placed or performed in visit on 02/02/17  POCT Microscopic Urinalysis ([facility])  Result Value Ref Range   WBC,UR,HPF,POC Few (A) None WBC/hpf   RBC,UR,HPF,POC None None RBC/hpf   Bacteria None None, Too numerous to count   Mucus Absent Absent   Epithelial Cells, UR Per Microscopy Few (A) None, Too numerous to count cells/hpf  POCT urinalysis dipstick  Result Value Ref Range   Color, UA yellow yellow   Clarity, UA clear clear   Glucose, UA negative negative   Bilirubin, UA negative negative   Ketones, POC UA trace (5) (A) negative   Spec Grav, UA 1.005 1.003, 1.005,  1.010, 1.015, 1.020, 1.025, 1.030, 1.035   Blood, UA negative negative   pH, UA 5.5 5.0, 5.5, 6.0, 6.5, 7.0, 7.5, 8.0   Protein Ur, POC negative negative   Urobilinogen, UA 0.2 0.2, 1.0, negative   Nitrite, UA Negative Negative   Leukocytes, UA small (1+) (A) Negative   Dep[facility] screen [facility] 2/9 02/02/2017 09/26/2016 09/19/2016  Decreased Interest 0 0 0  Down, Depressed, Hopeless 0 0 0  PHQ - 2 Score 0 0 0       Assessment & Plan:   1. Dysuria   2. Urinary frequency   3. High risk sexual behavior   4. Decreased libido   5. Other fatigue    -new onset dysuria and urinary frequency in pt with same sexual partners;  obtain GC/Chlam; obtain PSA; treat empirically with Rocephin and Doxycycline.   -ongoing fatigue and decreased libido; obtain labs including testosterone, TSH, B12, and Vitamin D.  No evidence of underlying depression or anxiety.   Orders Placed This Encounter  Procedures  . Urine culture  . GC/Chlamydia Probe Amp  . CBC with Differential/Platelet  . Comprehensive metabolic panel  . Hemoglobin A1c  . TSH  . Vitamin B12  . VITAMIN D 25 Hydroxy (Vit-D Deficiency, Fractures)  . Testosterone  . PSA  . HIV antibody  . RPR  . POCT Microscopic Urinalysis ([facility])  . POCT urinalysis dipstick   Meds ordered this encounter  Medications  . cefTRIAXone (ROCEPHIN) injection 1 g  . doxycycline (VIBRAMYCIN) 100 MG capsule    Sig: Take 1 capsule (100 mg total) by mouth 2 (two) times daily.    Dispense:  30 capsule    Refill:  1    No Follow-up on file.   Aiden Helzer Paulita Fujita, M.D. Primary Care at [facility] previously [facility] [address] [place], [place]  [address] [phone] phone [phone] fax

## 2017-02-02 NOTE — Patient Instructions (Addendum)
IF you received an x-ray today, you will receive an invoice from [facility]. Please contact [facility] at [phone] with questions or concerns regarding your invoice.   IF you received labwork today, you will receive an invoice from [facility]. Please contact [facility] at [phone] with questions or concerns regarding your invoice.   Our billing staff will not be able to assist you with questions regarding bills from these companies.  You will be contacted with the lab results as soon as they are available. The fastest way to get your results is to activate your My Chart account. Instructions are located on the last page of this paperwork. If you have not heard from us regarding the results in 2 weeks, please contact this office.    Dysuria Dysuria is pain or discomfort while urinating. The pain or discomfort may be felt in the tube that carries urine out of the bladder (urethra) or in the surrounding tissue of the genitals. The pain may also be felt in the groin area, lower abdomen, and lower back. You may have to urinate frequently or have the sudden feeling that you have to urinate (urgency). Dysuria can affect both men and women, but is more common in women. Dysuria can be caused by many different things, including:  Urinary tract infection in women.  Infection of the kidney or bladder.  Kidney stones or bladder stones.  Certain sexually transmitted infections (STIs), such as chlamydia.  Dehydration.  Inflammation of the vagina.  Use of certain medicines.  Use of certain soaps or scented products that cause irritation. Follow these instructions at home: Watch your dysuria for any changes. The following actions may help to reduce any discomfort you are feeling:  Drink enough fluid to keep your urine clear or pale yellow.  Empty your bladder often. Avoid holding urine for long periods of time.  After a bowel movement or urination, women should  cleanse from front to back, using each tissue only once.  Empty your bladder after sexual intercourse.  Take medicines only as directed by your health care provider.  If you were prescribed an antibiotic medicine, finish it all even if you start to feel better.  Avoid caffeine, tea, and alcohol. They can irritate the bladder and make dysuria worse. In men, alcohol may irritate the prostate.  Keep all follow-up visits as directed by your health care provider. This is important.  If you had any tests done to find the cause of dysuria, it is your responsibility to obtain your test results. Ask the lab or department performing the test when and how you will get your results. Talk with your health care provider if you have any questions about your results. Contact a health care provider if:  You develop pain in your back or sides.  You have a fever.  You have nausea or vomiting.  You have blood in your urine.  You are not urinating as often as you usually do. Get help right away if:  You pain is severe and not relieved with medicines.  You are unable to hold down any fluids.  You or someone else notices a change in your mental function.  You have a rapid heartbeat at rest.  You have shaking or chills.  You feel extremely weak. This information is not intended to replace advice given to you by your health care provider. Make sure you discuss any questions you have with your health care provider. Document Released: 08/05/2004 Document Revised: 04/14/2016  Document Reviewed: 07/03/2014 Elsevier Interactive Patient Education  2017 [employer].

## 2017-02-03 LAB — COMPREHENSIVE METABOLIC PANEL
ALT: 16 IU/L (ref 0–44)
AST: 18 IU/L (ref 0–40)
Albumin/Globulin Ratio: 1.7 (ref 1.2–2.2)
Albumin: 4.6 g/dL (ref 3.5–5.5)
Alkaline Phosphatase: 90 IU/L (ref 39–117)
BUN/Creatinine Ratio: 10 (ref 9–20)
BUN: 10 mg/dL (ref 6–24)
Bilirubin Total: 0.9 mg/dL (ref 0.0–1.2)
CO2: 24 mmol/L (ref 18–29)
Calcium: 9.8 mg/dL (ref 8.7–10.2)
Chloride: 97 mmol/L (ref 96–106)
Creatinine, Ser: 1.03 mg/dL (ref 0.76–1.27)
GFR calc Af Amer: 99 mL/min/{1.73_m2} (ref >59)
GFR calc non Af Amer: 85 mL/min/{1.73_m2} (ref >59)
Globulin, Total: 2.7 g/dL (ref 1.5–4.5)
Glucose: 90 mg/dL (ref 65–99)
Potassium: 4.4 mmol/L (ref 3.5–5.2)
Sodium: 137 mmol/L (ref 134–144)
Total Protein: 7.3 g/dL (ref 6.0–8.5)

## 2017-02-03 LAB — CBC WITH DIFFERENTIAL/PLATELET
Basophils Absolute: 0 10*3/uL (ref 0.0–0.2)
Basos: 0 %
EOS (ABSOLUTE): 0 10*3/uL (ref 0.0–0.4)
Eos: 1 %
Hematocrit: 41.6 % (ref 37.5–51.0)
Hemoglobin: 14.3 g/dL (ref 13.0–17.7)
Immature Grans (Abs): 0 10*3/uL (ref 0.0–0.1)
Immature Granulocytes: 0 %
Lymphocytes Absolute: 1.6 10*3/uL (ref 0.7–3.1)
Lymphs: 20 %
MCH: 29.9 pg (ref 26.6–33.0)
MCHC: 34.4 g/dL (ref 31.5–35.7)
MCV: 87 fL (ref 79–97)
Monocytes Absolute: 0.6 10*3/uL (ref 0.1–0.9)
Monocytes: 8 %
Neutrophils Absolute: 5.5 10*3/uL (ref 1.4–7.0)
Neutrophils: 71 %
Platelets: 305 10*3/uL (ref 150–379)
RBC: 4.79 x10E6/uL (ref 4.14–5.80)
RDW: 13.7 % (ref 12.3–15.4)
WBC: 7.7 10*3/uL (ref 3.4–10.8)

## 2017-02-03 LAB — PSA: Prostate Specific Ag, Serum: 13.2 ng/mL — ABNORMAL HIGH (ref 0.0–4.0)

## 2017-02-03 LAB — VITAMIN D 25 HYDROXY (VIT D DEFICIENCY, FRACTURES): Vit D, 25-Hydroxy: 29.4 ng/mL — ABNORMAL LOW (ref 30.0–100.0)

## 2017-02-03 LAB — TSH: TSH: 0.824 u[IU]/mL (ref 0.450–4.500)

## 2017-02-03 LAB — GC/CHLAMYDIA PROBE AMP
Chlamydia trachomatis, NAA: NEGATIVE
Neisseria gonorrhoeae by PCR: NEGATIVE

## 2017-02-03 LAB — HEMOGLOBIN A1C
Est. average glucose Bld gHb Est-mCnc: 97 mg/dL
Hgb A1c MFr Bld: 5 % (ref 4.8–5.6)

## 2017-02-03 LAB — VITAMIN B12: Vitamin B-12: 523 pg/mL (ref 232–1245)

## 2017-02-03 LAB — HIV ANTIBODY (ROUTINE TESTING W REFLEX): HIV Screen 4th Generation wRfx: NONREACTIVE

## 2017-02-03 LAB — TESTOSTERONE: Testosterone: 461 ng/dL (ref 264–916)

## 2017-02-03 LAB — RPR: RPR Ser Ql: NONREACTIVE

## 2017-02-04 LAB — URINE CULTURE: Organism ID, Bacteria: NO GROWTH

## 2017-02-08 ENCOUNTER — Encounter: Payer: Self-pay | Admitting: Family Medicine

## 2017-02-08 ENCOUNTER — Other Ambulatory Visit: Payer: Self-pay | Admitting: Family Medicine

## 2017-02-08 DIAGNOSIS — R972 Elevated prostate specific antigen [PSA]: Secondary | ICD-10-CM

## 2017-02-20 ENCOUNTER — Telehealth: Payer: Self-pay | Admitting: Family Medicine

## 2017-02-20 NOTE — Telephone Encounter (Signed)
Lab staff --- please call patient back with results.  Then ask if he needs me to call him back with further discussion/explanation.

## 2017-02-20 NOTE — Telephone Encounter (Signed)
Pt had missed Smiths call last week and wondered if she could give him a call back.  He is also wanting to know if his lab results came back and to go over them as well [phone]

## 2017-08-07 NOTE — Progress Notes (Signed)
Subjective:    Patient ID: Jeremy Cruz, male    DOB: 03-Aug-1966, 51 y.o.   MRN: [id number]  08/08/2017  Annual Exam and Hand Pain (right hand, pt states he has been having some nerve pain )   HPI This 51 y.o. male presents for Complete Physical Examination.  Last physical: four years ago. Eye exam:  A few years ago; need to find a new provider. Dental exam:  today   The science of fasting; it really works; drinking water and drinking black water.     Visual Acuity Screening   Right eye Left eye Both eyes  Without correction:  With correction:       BP Readings from Last 3 Encounters:  08/08/17 115/74  02/02/17 110/80  09/26/16 110/80   Wt Readings from Last 3 Encounters:  08/08/17 169 lb (76.7 kg)  02/02/17 176 lb (79.8 kg)  09/26/16 188 lb (85.3 kg)   Immunization History  Administered Date(s) Administered  . Hepatitis A 07/31/2012, 02/04/2013  . Influenza,inj,Quad PF,6+ Mos 08/08/2017  . Tdap 09/19/2016    Review of Systems  Constitutional: Negative for activity change, appetite change, chills, diaphoresis, fatigue, fever and unexpected weight change.  HENT: Negative for congestion, dental problem, drooling, ear discharge, ear pain, facial swelling, hearing loss, mouth sores, nosebleeds, postnasal drip, rhinorrhea, sinus pressure, sneezing, sore throat, tinnitus, trouble swallowing and voice change.   Eyes: Negative for photophobia, pain, discharge, redness, itching and visual disturbance.  Respiratory: Negative for apnea, cough, choking, chest tightness, shortness of breath, wheezing and stridor.   Cardiovascular: Negative for chest pain, palpitations and leg swelling.  Gastrointestinal: Negative for abdominal pain, blood in stool, constipation, diarrhea, nausea and vomiting.  Endocrine: Negative for cold intolerance, heat intolerance, polydipsia, polyphagia and polyuria.  Genitourinary: Negative for decreased urine volume, difficulty  urinating, discharge, dysuria, enuresis, flank pain, frequency, genital sores, hematuria, penile pain, penile swelling, scrotal swelling, testicular pain and urgency.       PRN Prevacid.  Nocturia x 1.   Urine stream is strong; weaker than when young.    Musculoskeletal: Negative for arthralgias, back pain, gait problem, joint swelling, myalgias, neck pain and neck stiffness.  Skin: Negative for color change, pallor, rash and wound.  Allergic/Immunologic: Negative for environmental allergies, food allergies and immunocompromised state.  Neurological: Negative for dizziness, tremors, seizures, syncope, facial asymmetry, speech difficulty, weakness, light-headedness, numbness and headaches.  Hematological: Negative for adenopathy. Does not bruise/bleed easily.  Psychiatric/Behavioral: Negative for agitation, behavioral problems, confusion, decreased concentration, dysphoric mood, hallucinations, self-injury, sleep disturbance and suicidal ideas. The patient is not nervous/anxious and is not hyperactive.     Past Medical History:  Diagnosis Date  . Allergy   . GERD (gastroesophageal reflux disease) 04/21/2012   History reviewed. No pertinent surgical history. Allergies  Allergen Reactions  . Asa [Aspirin] Anaphylaxis  . Nsaids Anaphylaxis    Social History   Social History  . Marital status: Single    Spouse name: N/A  . Number of children: N/A  . Years of education: N/A   Occupational History  . Not on file.   Social History Main Topics  . Smoking status: Former Games developer  . Smokeless tobacco: Never Used  . Alcohol use 1.2 oz/week    2 Cans of beer per week  . Drug use: No  . Sexual activity: Yes   Other Topics Concern  . Not on file   Social History Narrative   Marital status: single; +dating seriously same  sex partner x 17 years.   Children: none   Living: with same sex partner  X 17 years   Employment:  [employer] x 2000.   Tobacco:  Former smoker;  quit age 35.   Alcohol:  Socially Bud Light twice per month 2 beers  Per episode.   Drugs: none   Exercise:  None; walks a lot at work.  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     Sexually active: yes; total 10 male partners.   Seatbelt:  100% of time   Guns:  One unloaded in house.   Sunscreen: yes; SPF 30.   Family History  Problem Relation Age of Onset  . Cancer Father 54       Esophageal and Prostate  . Stroke Father   . Alcohol abuse Father        Objective:    BP 115/74   Pulse 65   Temp 97.6 F (36.4 C) (Oral)   Resp 16   Ht 5' 8.9" (1.75 m)   Wt 169 lb (76.7 kg)   SpO2 96%   BMI 25.03 kg/m  Physical Exam  Constitutional: He is oriented to person, place, and time. He appears well-developed and well-nourished. No distress.  HENT:  Head: Normocephalic and atraumatic.  Right Ear: External ear normal.  Left Ear: External ear normal.  Nose: Nose normal.  Mouth/Throat: Oropharynx is clear and moist.  Eyes: Pupils are equal, round, and reactive to light. Conjunctivae and EOM are normal.  Neck: Normal range of motion. Neck supple. Carotid bruit is not present. No thyromegaly present.  Cardiovascular: Normal rate, regular rhythm, normal heart sounds and intact distal pulses.  Exam reveals no gallop and no friction rub.   No murmur heard. Pulmonary/Chest: Effort normal and breath sounds normal. He has no wheezes. He has no rales.  Abdominal: Soft. Bowel sounds are normal. He exhibits no distension and no mass. There is no tenderness. There is no rebound and no guarding. Hernia confirmed negative in the right inguinal area and confirmed negative in the left inguinal area.  Genitourinary: Penis normal.  Musculoskeletal:       Right shoulder: Normal.       Left shoulder: Normal.       Cervical back: Normal.  Lymphadenopathy:    He has no cervical adenopathy.       Right: No inguinal adenopathy present.       Left: No inguinal adenopathy present.  Neurological: He is alert and oriented  to person, place, and time. He has normal reflexes. No cranial nerve deficit. He exhibits normal muscle tone. Coordination normal.  Skin: Skin is warm and dry. No rash noted. He is not diaphoretic.  Psychiatric: He has a normal mood and affect. His behavior is normal. Judgment and thought content normal.    No results found. Depression screen [facility] 2/9 08/08/2017 02/02/2017 09/26/2016 09/19/2016  Decreased Interest 0 0 0 0  Down, Depressed, Hopeless 0 0 0 0  PHQ - 2 Score 0 0 0 0   Fall Risk  08/08/2017 02/02/2017 09/26/2016 09/19/2016  Falls in the past year? No No Yes No  Number falls in past yr: - - 1 -        Assessment & Plan:   1. Routine physical examination   2. Screening for diabetes mellitus   3. Screening, lipid   4. Screening for prostate cancer   5. Vitamin D deficiency   6. High risk sexual behavior  7. Need for prophylactic vaccination and inoculation against influenza   8. Seasonal allergic rhinitis due to pollen   9. Gastroesophageal reflux disease without esophagitis   10. Neck sprain, initial encounter    -anticipatory guidance provided --- exercise, weight loss, safe driving practices, safe sexual practices. -obtain age appropriate screening labs and labs for chronic disease management. -continue current medications. -pt requesting flexeril for intermittent trapezius with shoulder sprain from previous injury.   Orders Placed This Encounter  Procedures  . GC/Chlamydia Probe Amp  . Flu Vaccine QUAD 36+ mos IM  . CBC with Differential/Platelet  . Comprehensive metabolic panel    Order Specific Question:   Has the patient fasted?    Answer:   Yes  . Lipid panel    Order Specific Question:   Has the patient fasted?    Answer:   Yes  . PSA  . HIV antibody  . RPR  . VITAMIN D 25 Hydroxy (Vit-D Deficiency, Fractures)  . Urinalysis, Routine w reflex microscopic   Meds ordered this encounter  Medications  . fexofenadine (ALLEGRA) 180 MG tablet    Sig: Take  180 mg by mouth daily.  . cyclobenzaprine (FLEXERIL) 5 MG tablet    Sig: Take 1 tablet (5 mg total) by mouth 3 (three) times daily as needed for muscle spasms.    Dispense:  30 tablet    Refill:  1    Return in about 1 year (around 08/08/2018) for complete physical examiniation.   Caitrin Pendergraph Paulita Fujita, M.D. Primary Care at [facility] previously [facility] [address] [place], [place]  [address] [phone] phone [phone] fax

## 2017-08-08 ENCOUNTER — Ambulatory Visit (INDEPENDENT_AMBULATORY_CARE_PROVIDER_SITE_OTHER): Payer: BLUE CROSS/BLUE SHIELD | Admitting: Family Medicine

## 2017-08-08 ENCOUNTER — Encounter: Payer: Self-pay | Admitting: Family Medicine

## 2017-08-08 VITALS — BP 115/74 | HR 65 | Temp 97.6°F | Resp 16 | Ht 68.9 in | Wt 169.0 lb

## 2017-08-08 DIAGNOSIS — Z8042 Family history of malignant neoplasm of prostate: Secondary | ICD-10-CM

## 2017-08-08 DIAGNOSIS — S139XXA Sprain of joints and ligaments of unspecified parts of neck, initial encounter: Secondary | ICD-10-CM

## 2017-08-08 DIAGNOSIS — Z23 Encounter for immunization: Secondary | ICD-10-CM | POA: Diagnosis not present

## 2017-08-08 DIAGNOSIS — J301 Allergic rhinitis due to pollen: Secondary | ICD-10-CM

## 2017-08-08 DIAGNOSIS — K219 Gastro-esophageal reflux disease without esophagitis: Secondary | ICD-10-CM | POA: Diagnosis not present

## 2017-08-08 DIAGNOSIS — Z0000 Encounter for general adult medical examination without abnormal findings: Secondary | ICD-10-CM | POA: Diagnosis not present

## 2017-08-08 DIAGNOSIS — Z125 Encounter for screening for malignant neoplasm of prostate: Secondary | ICD-10-CM

## 2017-08-08 DIAGNOSIS — Z13220 Encounter for screening for lipoid disorders: Secondary | ICD-10-CM | POA: Diagnosis not present

## 2017-08-08 DIAGNOSIS — Z7251 High risk heterosexual behavior: Secondary | ICD-10-CM

## 2017-08-08 DIAGNOSIS — Z131 Encounter for screening for diabetes mellitus: Secondary | ICD-10-CM | POA: Diagnosis not present

## 2017-08-08 DIAGNOSIS — E559 Vitamin D deficiency, unspecified: Secondary | ICD-10-CM | POA: Diagnosis not present

## 2017-08-08 MED ORDER — CYCLOBENZAPRINE HCL 5 MG PO TABS: 5.0000 mg | ORAL_TABLET | Freq: Three times a day (TID) | ORAL | 1 refills | Status: DC | PRN

## 2017-08-08 NOTE — Patient Instructions (Addendum)
   IF you received an x-ray today, you will receive an invoice from [facility]. Please contact [facility] at [phone] with questions or concerns regarding your invoice.   IF you received labwork today, you will receive an invoice from [facility]. Please contact [facility] at [phone] with questions or concerns regarding your invoice.   Our billing staff will not be able to assist you with questions regarding bills from these companies.  You will be contacted with the lab results as soon as they are available. The fastest way to get your results is to activate your My Chart account. Instructions are located on the last page of this paperwork. If you have not heard from us regarding the results in 2 weeks, please contact this office.      Preventive Care 40-64 Years, Male Preventive care refers to lifestyle choices and visits with your health care provider that can promote health and wellness. What does preventive care include?  A yearly physical exam. This is also called an annual well check.  Dental exams once or twice a year.  Routine eye exams. Ask your health care provider how often you should have your eyes checked.  Personal lifestyle choices, including: ? Daily care of your teeth and gums. ? Regular physical activity. ? Eating a healthy diet. ? Avoiding tobacco and drug use. ? Limiting alcohol use. ? Practicing safe sex. ? Taking low-dose aspirin every day starting at age 50. What happens during an annual well check? The services and screenings done by your health care provider during your annual well check will depend on your age, overall health, lifestyle risk factors, and family history of disease. Counseling Your health care provider may ask you questions about your:  Alcohol use.  Tobacco use.  Drug use.  Emotional well-being.  Home and relationship well-being.  Sexual activity.  Eating habits.  Work and work  environment.  Screening You may have the following tests or measurements:  Height, weight, and BMI.  Blood pressure.  Lipid and cholesterol levels. These may be checked every 5 years, or more frequently if you are over 50 years old.  Skin check.  Lung cancer screening. You may have this screening every year starting at age 55 if you have a 30-pack-year history of smoking and currently smoke or have quit within the past 15 years.  Fecal occult blood test (FOBT) of the stool. You may have this test every year starting at age 50.  Flexible sigmoidoscopy or colonoscopy. You may have a sigmoidoscopy every 5 years or a colonoscopy every 10 years starting at age 50.  Prostate cancer screening. Recommendations will vary depending on your family history and other risks.  Hepatitis C blood test.  Hepatitis B blood test.  Sexually transmitted disease (STD) testing.  Diabetes screening. This is done by checking your blood sugar (glucose) after you have not eaten for a while (fasting). You may have this done every 1-3 years.  Discuss your test results, treatment options, and if necessary, the need for more tests with your health care provider. Vaccines Your health care provider may recommend certain vaccines, such as:  Influenza vaccine. This is recommended every year.  Tetanus, diphtheria, and acellular pertussis (Tdap, Td) vaccine. You may need a Td booster every 10 years.  Varicella vaccine. You may need this if you have not been vaccinated.  Zoster vaccine. You may need this after age 60.  Measles, mumps, and rubella (MMR) vaccine. You may need at least one dose   of MMR if you were born in 1957 or later. You may also need a second dose.  Pneumococcal 13-valent conjugate (PCV13) vaccine. You may need this if you have certain conditions and have not been vaccinated.  Pneumococcal polysaccharide (PPSV23) vaccine. You may need one or two doses if you smoke cigarettes or if you have  certain conditions.  Meningococcal vaccine. You may need this if you have certain conditions.  Hepatitis A vaccine. You may need this if you have certain conditions or if you travel or work in places where you may be exposed to hepatitis A.  Hepatitis B vaccine. You may need this if you have certain conditions or if you travel or work in places where you may be exposed to hepatitis B.  Haemophilus influenzae type b (Hib) vaccine. You may need this if you have certain risk factors.  Talk to your health care provider about which screenings and vaccines you need and how often you need them. This information is not intended to replace advice given to you by your health care provider. Make sure you discuss any questions you have with your health care provider. Document Released: 12/04/2015 Document Revised: 07/27/2016 Document Reviewed: 09/08/2015 Elsevier Interactive Patient Education  2017 [place].   Shoulder Impingement Syndrome Rehab Ask your health care provider which exercises are safe for you. Do exercises exactly as told by your health care provider and adjust them as directed. It is normal to feel mild stretching, pulling, tightness, or discomfort as you do these exercises, but you should stop right away if you feel sudden pain or your pain gets worse.Do not begin these exercises until told by your health care provider. Stretching and range of motion exercise This exercise warms up your muscles and joints and improves the movement and flexibility of your shoulder. This exercise also helps to relieve pain and stiffness. Exercise A: Passive horizontal adduction  1. Sit or stand and pull your left / right elbow across your chest, toward your other shoulder. Stop when you feel a gentle stretch in the back of your shoulder and upper arm. ? Keep your arm at shoulder height. ? Keep your arm as close to your body as you comfortably can. 2. Hold for __________ seconds. 3. Slowly return to  the starting position. Repeat __________ times. Complete this exercise __________ times a day. Strengthening exercises These exercises build strength and endurance in your shoulder. Endurance is the ability to use your muscles for a long time, even after they get tired. Exercise B: External rotation, isometric 1. Stand or sit in a doorway, facing the door frame. 2. Bend your left / right elbow and place the back of your wrist against the door frame. Only your wrist should be touching the frame. Keep your upper arm at your side. 3. Gently press your wrist against the door frame, as if you are trying to push your arm away from your abdomen. ? Avoid shrugging your shoulder while you press your hand against the door frame. Keep your shoulder blade tucked down toward the middle of your back. 4. Hold for __________ seconds. 5. Slowly release the tension, and relax your muscles completely before you do the exercise again. Repeat __________ times. Complete this exercise __________ times a day. Exercise C: Internal rotation, isometric  1. Stand or sit in a doorway, facing the door frame. 2. Bend your left / right elbow and place the inside of your wrist against the door frame. Only your wrist should be touching  the frame. Keep your upper arm at your side. 3. Gently press your wrist against the door frame, as if you are trying to push your arm toward your abdomen. ? Avoid shrugging your shoulder while you press your hand against the door frame. Keep your shoulder blade tucked down toward the middle of your back. 4. Hold for __________ seconds. 5. Slowly release the tension, and relax your muscles completely before you do the exercise again. Repeat __________ times. Complete this exercise __________ times a day. Exercise D: Scapular protraction, supine  1. Lie on your back on a firm surface. Hold a __________ weight in your left / right hand. 2. Raise your left / right arm straight into the air so your  hand is directly above your shoulder joint. 3. Push the weight into the air so your shoulder lifts off of the surface that you are lying on. Do not move your head, neck, or back. 4. Hold for __________ seconds. 5. Slowly return to the starting position. Let your muscles relax completely before you repeat this exercise. Repeat __________ times. Complete this exercise __________ times a day. Exercise E: Scapular retraction  1. Sit in a stable chair without armrests, or stand. 2. Secure an exercise band to a stable object in front of you so the band is at shoulder height. 3. Hold one end of the exercise band in each hand. Your palms should face down. 4. Squeeze your shoulder blades together and move your elbows slightly behind you. Do not shrug your shoulders while you do this. 5. Hold for __________ seconds. 6. Slowly return to the starting position. Repeat __________ times. Complete this exercise __________ times a day. Exercise F: Shoulder extension  1. Sit in a stable chair without armrests, or stand. 2. Secure an exercise band to a stable object in front of you where the band is above shoulder height. 3. Hold one end of the exercise band in each hand. 4. Straighten your elbows and lift your hands up to shoulder height. 5. Squeeze your shoulder blades together and pull your hands down to the sides of your thighs. Stop when your hands are straight down by your sides. Do not let your hands go behind your body. 6. Hold for __________ seconds. 7. Slowly return to the starting position. Repeat __________ times. Complete this exercise __________ times a day. This information is not intended to replace advice given to you by your health care provider. Make sure you discuss any questions you have with your health care provider. Document Released: 11/07/2005 Document Revised: 07/14/2016 Document Reviewed: 10/10/2015 Elsevier Interactive Patient Education  [employer].

## 2017-08-09 LAB — COMPREHENSIVE METABOLIC PANEL
ALT: 33 IU/L (ref 0–44)
AST: 27 IU/L (ref 0–40)
Albumin/Globulin Ratio: 2 (ref 1.2–2.2)
Albumin: 5.1 g/dL (ref 3.5–5.5)
Alkaline Phosphatase: 65 IU/L (ref 39–117)
BUN/Creatinine Ratio: 13 (ref 9–20)
BUN: 13 mg/dL (ref 6–24)
Bilirubin Total: 0.8 mg/dL (ref 0.0–1.2)
CO2: 25 mmol/L (ref 20–29)
Calcium: 10 mg/dL (ref 8.7–10.2)
Chloride: 97 mmol/L (ref 96–106)
Creatinine, Ser: 1.01 mg/dL (ref 0.76–1.27)
GFR calc Af Amer: 101 mL/min/{1.73_m2} (ref >59)
GFR calc non Af Amer: 88 mL/min/{1.73_m2} (ref >59)
Globulin, Total: 2.6 g/dL (ref 1.5–4.5)
Glucose: 98 mg/dL (ref 65–99)
Potassium: 4.3 mmol/L (ref 3.5–5.2)
Sodium: 138 mmol/L (ref 134–144)
Total Protein: 7.7 g/dL (ref 6.0–8.5)

## 2017-08-09 LAB — CBC WITH DIFFERENTIAL/PLATELET
Basophils Absolute: 0 10*3/uL (ref 0.0–0.2)
Basos: 1 %
EOS (ABSOLUTE): 0.1 10*3/uL (ref 0.0–0.4)
Eos: 1 %
Hematocrit: 45.3 % (ref 37.5–51.0)
Hemoglobin: 15.4 g/dL (ref 13.0–17.7)
Immature Grans (Abs): 0 10*3/uL (ref 0.0–0.1)
Immature Granulocytes: 0 %
Lymphocytes Absolute: 2.1 10*3/uL (ref 0.7–3.1)
Lymphs: 33 %
MCH: 29.9 pg (ref 26.6–33.0)
MCHC: 34 g/dL (ref 31.5–35.7)
MCV: 88 fL (ref 79–97)
Monocytes Absolute: 0.4 10*3/uL (ref 0.1–0.9)
Monocytes: 7 %
Neutrophils Absolute: 3.7 10*3/uL (ref 1.4–7.0)
Neutrophils: 58 %
Platelets: 298 10*3/uL (ref 150–379)
RBC: 5.15 x10E6/uL (ref 4.14–5.80)
RDW: 13.7 % (ref 12.3–15.4)
WBC: 6.3 10*3/uL (ref 3.4–10.8)

## 2017-08-09 LAB — URINALYSIS, ROUTINE W REFLEX MICROSCOPIC
Bilirubin, UA: NEGATIVE
Glucose, UA: NEGATIVE
Ketones, UA: NEGATIVE
Leukocytes, UA: NEGATIVE
Nitrite, UA: NEGATIVE
Protein, UA: NEGATIVE
RBC, UA: NEGATIVE
Specific Gravity, UA: 1.012 (ref 1.005–1.030)
Urobilinogen, Ur: 0.2 mg/dL (ref 0.2–1.0)
pH, UA: 7.5 (ref 5.0–7.5)

## 2017-08-09 LAB — GC/CHLAMYDIA PROBE AMP
Chlamydia trachomatis, NAA: NEGATIVE
Neisseria gonorrhoeae by PCR: NEGATIVE

## 2017-08-09 LAB — RPR: RPR Ser Ql: NONREACTIVE

## 2017-08-09 LAB — VITAMIN D 25 HYDROXY (VIT D DEFICIENCY, FRACTURES): Vit D, 25-Hydroxy: 39 ng/mL (ref 30.0–100.0)

## 2017-08-09 LAB — LIPID PANEL
Chol/HDL Ratio: 2.3 ratio (ref 0.0–5.0)
Cholesterol, Total: 202 mg/dL — ABNORMAL HIGH (ref 100–199)
HDL: 87 mg/dL (ref >39)
LDL Calculated: 100 mg/dL — ABNORMAL HIGH (ref 0–99)
Triglycerides: 74 mg/dL (ref 0–149)
VLDL Cholesterol Cal: 15 mg/dL (ref 5–40)

## 2017-08-09 LAB — PSA: Prostate Specific Ag, Serum: 1.2 ng/mL (ref 0.0–4.0)

## 2017-08-09 LAB — HIV ANTIBODY (ROUTINE TESTING W REFLEX): HIV Screen 4th Generation wRfx: NONREACTIVE

## 2017-08-11 DIAGNOSIS — Z8042 Family history of malignant neoplasm of prostate: Secondary | ICD-10-CM | POA: Insufficient documentation

## 2017-08-11 DIAGNOSIS — K219 Gastro-esophageal reflux disease without esophagitis: Secondary | ICD-10-CM | POA: Insufficient documentation

## 2017-08-11 DIAGNOSIS — J301 Allergic rhinitis due to pollen: Secondary | ICD-10-CM | POA: Insufficient documentation

## 2017-10-16 ENCOUNTER — Ambulatory Visit (INDEPENDENT_AMBULATORY_CARE_PROVIDER_SITE_OTHER): Payer: Worker's Compensation | Admitting: Family Medicine

## 2017-10-16 ENCOUNTER — Ambulatory Visit (INDEPENDENT_AMBULATORY_CARE_PROVIDER_SITE_OTHER): Payer: BLUE CROSS/BLUE SHIELD

## 2017-10-16 ENCOUNTER — Other Ambulatory Visit: Payer: Self-pay

## 2017-10-16 VITALS — BP 122/70 | HR 102 | Temp 98.4°F | Wt 175.4 lb

## 2017-10-16 DIAGNOSIS — S4380XA Sprain of other specified parts of unspecified shoulder girdle, initial encounter: Secondary | ICD-10-CM

## 2017-10-16 DIAGNOSIS — M542 Cervicalgia: Secondary | ICD-10-CM | POA: Diagnosis not present

## 2017-10-16 DIAGNOSIS — R202 Paresthesia of skin: Secondary | ICD-10-CM

## 2017-10-16 MED ORDER — PREDNISONE 20 MG PO TABS: ORAL_TABLET | ORAL | 0 refills | Status: DC

## 2017-10-16 NOTE — Progress Notes (Signed)
Subjective:    Patient ID: Jeremy HerrlichSteven Mulcahey, male    DOB: 1966/02/22, 51 y.o.   MRN: [id number]  10/16/2017  Shoulder Pain (Due to previous fall, dull aching pain. pain scale 5)    HPI This 51 y.o. male presents for evaluation of shoulder pain.  One year ago, evaluated for Right hand wound puncture.  S/p irrigation and debridement of R hand.  When had RIGHT hand puncture wound, developed sharp nerve pain that radiates from hand to upper arm and up to axilla and R shoulder.   Flexeril is helping but is not the answer. Not resolving the issue.   No neck pain associated; pain starts R scapular region and also involves elbow and forearm.  Has TENS unit at home without improvement.    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  Having shoulder forward provides relief.  Cannot lay on stomach flat or on R side, because pain is worse.  Previous [facility] in the past.   BP Readings from Last 3 Encounters:  10/16/17 122/70  08/08/17 115/74  02/02/17 110/80   Wt Readings from Last 3 Encounters:  10/16/17 175 lb 6.4 oz (79.6 kg)  08/08/17 169 lb (76.7 kg)  02/02/17 176 lb (79.8 kg)   Immunization History  Administered Date(s) Administered  . Hepatitis A 07/31/2012, 02/04/2013  . Influenza,inj,Quad PF,6+ Mos 08/08/2017  . Tdap 09/19/2016    Review of Systems  Constitutional: Negative for activity change, appetite change, chills, diaphoresis, fatigue and fever.  Respiratory: Negative for cough and shortness of breath.   Cardiovascular: Negative for chest pain, palpitations and leg swelling.  Gastrointestinal: Negative for abdominal pain, diarrhea, nausea and vomiting.  Endocrine: Negative for cold intolerance, heat intolerance, polydipsia, polyphagia and polyuria.  Musculoskeletal: Positive for arthralgias, myalgias, neck pain and neck stiffness.  Skin: Negative for color change, rash and wound.  Neurological: Positive for  numbness. Negative for dizziness, tremors, seizures, syncope, facial asymmetry, speech difficulty, weakness, light-headedness and headaches.  Psychiatric/Behavioral: Negative for dysphoric mood and sleep disturbance. The patient is not nervous/anxious.     Past Medical History:  Diagnosis Date  . Allergy   . GERD (gastroesophageal reflux disease) 04/21/2012   No past surgical history on file. Allergies  Allergen Reactions  . Asa [Aspirin] Anaphylaxis  . Nsaids Anaphylaxis   Current Outpatient Medications on File Prior to Visit  Medication Sig Dispense Refill  . cyclobenzaprine (FLEXERIL) 5 MG tablet Take 1 tablet (5 mg total) by mouth 3 (three) times daily as needed for muscle spasms. 30 tablet 1  . lansoprazole (PREVACID) 15 MG capsule Take 15 mg by mouth daily.    . fexofenadine (ALLEGRA) 180 MG tablet Take 180 mg by mouth daily.     No current facility-administered medications on file prior to visit.    Social History   Socioeconomic History  . Marital status: Single    Spouse name: Not on file  . Number of children: Not on file  . Years of education: Not on file  . Highest education level: Not on file  Social Needs  . Financial resource strain: Not on file  . Food insecurity - worry: Not on file  . Food insecurity - inability: Not on file  . Transportation needs - medical: Not on file  . Transportation needs - non-medical: Not on file  Occupational History  . Not on file  Tobacco Use  . Smoking status: Former Games developermoker  . Smokeless tobacco: Never Used  Substance and Sexual Activity  . Alcohol use: Yes    Alcohol/week: 1.2 oz    Types: 2 Cans of beer per week  . Drug use: No  . Sexual activity: Yes  Other Topics Concern  . Not on file  Social History Narrative   Marital status: single; +dating seriously same sex partner x 17 years.   Children: none   Living: with same sex partner  X 17 years   Employment:  [employer] x 2000.   Tobacco:   Former smoker; quit age 53.   Alcohol:  Socially Bud Light twice per month 2 beers  Per episode.   Drugs: none   Exercise:  None; walks a lot at work.  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     Sexually active: yes; total 1 male partners.   Seatbelt:  100% of time   Guns:  One unloaded in house.   Sunscreen: yes; SPF 30.   Family History  Problem Relation Age of Onset  . Cancer Father 47       Esophageal and Prostate  . Stroke Father   . Alcohol abuse Father        Objective:    BP 122/70 (BP Location: Left Arm, Patient Position: Sitting, Cuff Size: Large)   Pulse (!) 102   Temp 98.4 F (36.9 C) (Oral)   Wt 175 lb 6.4 oz (79.6 kg)   SpO2 99%   BMI 25.98 kg/m  Physical Exam  Constitutional: He is oriented to person, place, and time. He appears well-developed and well-nourished. No distress.  HENT:  Head: Normocephalic and atraumatic.  Right Ear: External ear normal.  Left Ear: External ear normal.  Nose: Nose normal.  Mouth/Throat: Oropharynx is clear and moist.  Eyes: Conjunctivae and EOM are normal. Pupils are equal, round, and reactive to light.  Neck: Normal range of motion. Neck supple. Carotid bruit is not present. No thyromegaly present.  Cardiovascular: Normal rate, regular rhythm, normal heart sounds and intact distal pulses. Exam reveals no gallop and no friction rub.  No murmur heard. Pulmonary/Chest: Effort normal and breath sounds normal. He has no wheezes. He has no rales.  Musculoskeletal:       Right shoulder: He exhibits pain. He exhibits normal range of motion, no tenderness, no bony tenderness, no spasm, normal pulse and normal strength.       Right elbow: Normal.      Right wrist: Normal.       Cervical back: He exhibits tenderness, pain and spasm. He exhibits normal range of motion and no bony tenderness.       Thoracic back: Normal.       Back:       Right forearm: Normal.       Right hand: Normal. Normal sensation noted. Normal strength noted.  +TTP R  scapular region with ROM of neck and R shoulder.   Lymphadenopathy:    He has no cervical adenopathy.  Neurological: He is alert and oriented to person, place, and time. No cranial nerve deficit.  Skin: Skin is warm and dry. No rash noted. He is not diaphoretic.  Psychiatric: He has a normal mood and affect. His behavior is normal.  Nursing note and vitals reviewed.  No results found. Depression screen [facility] 2/9 10/16/2017 08/08/2017 02/02/2017 09/26/2016 09/19/2016  Decreased Interest 0 0 0 0 0  Down, Depressed, Hopeless 0 0 0 0 0  PHQ - 2 Score 0 0 0 0 0  Fall Risk  10/16/2017 08/08/2017 02/02/2017 09/26/2016 09/19/2016  Falls in the past year? No No No Yes No  Number falls in past yr: - - - 1 -        Assessment & Plan:   1. Paresthesias in right hand   2. Scapulocostal sprain, initial encounter     New onset scapular pain with paresthesias radiate pain down into her right hand.  Onset after trauma to right hand and fall.  Not clear if suffering with neck strain with radicular symptoms versus nerve injury to right hand with radiation up into the forearm.  Obtain cervical spine films to evaluate further.  Duration of symptoms for 1 year.  Will refer to orthopedics for further evaluation due to long duration of symptoms.  Recommend home exercise program to be performed daily.  Have treated patient in the past with anti-inflammatories and muscle relaxers.  Will prescribe prednisone to decrease inflammation along nerve distribution.  Orders Placed This Encounter  Procedures  . DG Cervical Spine Complete    Standing Status:   Future    Number of Occurrences:   1    Standing Expiration Date:   10/16/2018    Order Specific Question:   Reason for Exam (SYMPTOM  OR DIAGNOSIS REQUIRED)    Answer:   R scapular pain with radicular symptoms    Order Specific Question:   Preferred imaging location?    Answer:   External  . Ambulatory referral to Orthopedic Surgery    Referral Priority:    Routine    Referral Type:   Surgical    Referral Reason:   Specialty Services Required    Requested Specialty:   Orthopedic Surgery    Number of Visits Requested:   1   Meds ordered this encounter  Medications  . predniSONE (DELTASONE) 20 MG tablet    Sig: Take 3 PO QAM x 1 day, 2 PO QAM x 5 days, 1 PO QAM x 5 days    Dispense:  18 tablet    Refill:  0    No Follow-up on file.   Reni Hausner Paulita FujitaMartin Saifan Rayford, M.D. [facility]  [place] previously [facility] [address] [place], [place]  [id number] [phone] phone [phone]) [phone] fax

## 2017-10-16 NOTE — Patient Instructions (Signed)
   IF you received an x-ray today, you will receive an invoice from [facility]. Please contact [facility] at [phone] with questions or concerns regarding your invoice.   IF you received labwork today, you will receive an invoice from [facility]. Please contact [facility] at [phone] with questions or concerns regarding your invoice.   Our billing staff will not be able to assist you with questions regarding bills from these companies.  You will be contacted with the lab results as soon as they are available. The fastest way to get your results is to activate your My Chart account. Instructions are located on the last page of this paperwork. If you have not heard from us regarding the results in 2 weeks, please contact this office.      Scapular Winging Rehab Ask your health care provider which exercises are safe for you. Do exercises exactly as told by your health care provider and adjust them as directed. It is normal to feel mild stretching, pulling, tightness, or discomfort as you do these exercises, but you should stop right away if you feel sudden pain or your pain gets worse.Do not begin these exercises until told by your health care provider. Stretching and range of motion exercises These exercises warm up your muscles and joints and improve the movement and flexibility of your shoulder. These exercises also help to relieve pain, numbness, and tingling. Exercise A: Pendulum  1. Stand near a wall or a surface that you can hold onto for balance. 2. Bend at the waist and let your left / right arm hang straight down. Use your other arm to support you. 3. Relax your arm and shoulder muscles, and move your hips and your trunk so your left / right arm swings freely. Your arm should swing because of the motion of your body, not because you are using your arm or shoulder muscles. 4. Keep moving so your arm swings in the following directions, as told by your health  care provider: ? Side to side. ? Forward and backward. ? In clockwise and counterclockwise circles. 5. Slowly return to the starting position. Repeat __________ times. Complete this exercise __________ times a day. Exercise B: Flexion, seated  1. Sit in a stable chair so your left / right forearm can rest on a flat surface. Your elbow should rest at a height that keeps your upper arm next to your body. 2. Keeping your shoulder relaxed, lean forward at the waist and let your hand slide forward. Stop when you feel a stretch in your shoulder, or when you reach the angle that is recommended by your health care provider. 3. Hold for __________ seconds. 4. Slowly return to the starting position. Repeat __________ times. Complete this exercise __________ times a day. Exercise C: Flexion, standing  1. Stand and hold a broomstick, a cane, or a similar object with your hands a little more than shoulder-width apart on the object. Your left / right hand should be palm-up, and your other hand should be palm-down. 2. Use both hands to raise the stick in front of your body and then over your head. Stop when you feel a stretch in your shoulder, or when you reach the angle that is recommended by your health care provider. ? Keep your left / right elbow straight and keep your shoulder muscles relaxed. ? Avoid shrugging your shoulder while you raise your arm. Keep your shoulder blade tucked down toward the middle of your spine. 3. Hold for __________   seconds. 4. Slowly return to the starting position. Repeat __________ times. Complete this exercise __________ times a day. Exercise D: Abduction, supine  1. Lie on your back and hold a broomstick, a cane, or a similar object. Place your hands a little more than shoulder-width apart on the object. Your left / right hand should be palm-up, and your other hand should be palm-down. 2. Push the stick to raise your left / right arm out to your side and then over your  head. Use your other hand to help move the stick. Stop when you feel a stretch in your shoulder, or when you reach the angle that is recommended by your health care provider. ? Avoid shrugging your shoulder while you raise your arm. Keep your shoulder blade tucked down toward the middle of your spine. 3. Hold for __________ seconds. 4. Slowly return to the starting position. Repeat __________ times. Complete this exercise __________ times a day. Exercise E: Flexion, active-assisted  1. Lie on your back. You may bend your knees for comfort. 2. Hold a broomstick, a cane, or a similar object with your hands about shoulder-width apart on the object. Your palms should face toward your feet. 3. Use both hands to raise the stick toward the ceiling. Continue by moving your arms in front of your face, then behind your head, toward the floor. Use your uninjured arm to help your left / right arm move farther. Stop when you feel a gentle stretch in your shoulder, or when you reach the angle where your health care provider tells you to stop. 4. Hold for __________ seconds. 5. Slowly return to the starting position. Repeat __________ times. Complete this exercise __________ times a day. Strengthening exercises These exercises build strength and endurance in your shoulder. Endurance is the ability to use your muscles for a long time, even after they get tired. Exercise F: Scapular depression and adduction 1. Sit on a stable chair. Support your arms in front of you with pillows, armrests, or a tabletop. Keep your elbows near the sides of your body. 2. Gently move your shoulder blades down and back toward your spine. Relax the muscles on the tops of your shoulders and in the back of your neck. 3. Hold for __________ seconds. 4. Slowly release the tension, and relax your muscles completely before you repeat the exercise. Repeat __________ times. Complete this exercise __________ times a day. After you have  practiced this exercise, try doing it without the arm support. Then, try doing it while standing instead of sitting. Exercise G: Scapular protraction, standing  1. Stand so you are facing a wall, about one arm-length away from the wall. 2. Place your hands on the wall and straighten your elbows. 3. Move your shoulder blades down, toward the middle of your spine. 4. Keep your shoulder blades down and move them forward, toward the wall. You should feel your shoulder blades sliding forward, around your ribcage. ? If you are not sure that you are doing this exercise correctly, ask your health care provider for more instructions. 5. Hold for __________ seconds. 6. Slowly return to the starting position. Let your muscles relax completely before you repeat this exercise. Repeat __________ times. Complete this exercise __________ times a day. Exercise H: Scapular protraction, supine  1. Lie on your back on a firm surface. Hold a __________ weight in your left / right hand. 2. Raise your left / right arm straight into the air so your hand is directly above your   shoulder joint. 3. Push the weight into the air so your shoulder blade lifts off of the surface that you are lying on. Do not move your head, neck, or back. 4. Hold for __________ seconds. 5. Slowly return to the starting position. Let your muscles relax completely before you repeat this exercise. Repeat __________ times. Complete this exercise __________ times a day. Exercise I: Scapular protraction, quadruped  1. Get on your hands and knees. Your hands should be directly below your shoulder blades. 2. Straighten your arms until your elbows are locked. 3. Round your back as much as you can. Think about lifting your rib cage up into your shoulder blades. Keep your neck muscles relaxed. 4. Hold for __________ seconds. 5. Slowly return to the starting position. Let your muscles relax completely before you repeat this exercise. Repeat __________  times. Complete this exercise __________ times a day. Exercise J: Scapular depression  1. Sit in a stable chair that has armrests. Sit upright, with your feet flat on the floor. 2. Put your hands on the armrests with your elbows bent and your fingers pointing forward. Your hands should be about even with the sides of your body. 3. Push down on the armrests to lift yourself off of the chair. Straighten your elbows and lift yourself up as much as you comfortably can. ? Move your shoulder blades down and back. ? Do not let your shoulders move up toward your ears. ? Keep your feet on the ground. As you get stronger, your feet should support less of your body weight as you do this exercise. 4. Hold for __________ seconds. 5. Slowly lower yourself back into the chair. Repeat __________ times. Complete this exercise __________ times a day. Exercise K: Shoulder extension, prone  1. Lie on your abdomen on a firm surface so your left / right arm hangs over the edge. 2. Hold a __________ weight in your left / right hand so your palm faces in toward your body. Your arm should be straight. 3. Squeeze your shoulder blade down toward the middle of your back. 4. Slowly raise your arm behind you and toward the ceiling, up to the height of the surface that you are lying on. Keep your arm straight. 5. Hold for __________ seconds. 6. Slowly return to the starting position and relax your muscles. Repeat __________ times. Complete this exercise __________ times a day. Exercise L: Horizontal abduction, prone 1. Lie on your abdomen on a firm surface so your left / right arm hangs over the edge. 2. Hold a __________ weight in your hand so your palm faces toward your feet. Your arm should be straight. 3. Squeeze your shoulder blade down toward the middle of your back. 4. Bend your elbow so your hand moves up, until your elbow is bent to an "L" shape (90 degree angle). With your elbow bent, slowly move your forearm  forward and up. Raise your hand up to the height of the surface that you are lying on. ? Your upper arm should not move, and your elbow should stay bent. ? At the top of the movement, your palm should face the floor. 5. Hold for __________ seconds. 6. Slowly return to the starting position and relax your muscles. Repeat __________ times. Complete this exercise __________ times a day. Exercise M: Scapular retraction  1. Sit in a stable chair without armrests, or stand. 2. Secure an exercise band to a stable object in front of you so it is at shoulder height.   3. Hold one end of the exercise band in each hand. Your palms should face down. 4. Straighten your elbows and lift your arms up to shoulder height. 5. Step back, away from the secured end of the exercise band, until the band stretches. 6. Squeeze your shoulder blades together and move your elbows back behind you. Do not shrug your shoulders while you do this. ? Your elbows should stay at about chest or shoulder height. ? Keep your upper arms lifted, away from your sides. 7. Hold for __________ seconds. 8. Slowly return to the starting position. Repeat __________ times. Complete this exercise __________ times a day. Exercise N: Shoulder extension  1. Sit in a stable chair without armrests, or stand. 2. Secure an exercise band to a stable object in front of you so it is at shoulder height. 3. Hold one end of the exercise band in each hand. Your palms should face each other. 4. Straighten your elbows and lift your hands up to shoulder height. 5. Step back, away from the secured end of the exercise band, until the band stretches. 6. Squeeze your shoulder blades together and pull your hands down to the sides of your thighs. Stop when your hands are straight down by your sides. Do not let your hands go behind your body. 7. Hold for __________ seconds. 8. Slowly return to the starting position. Repeat __________ times. Complete this exercise  __________ times a day. Exercise O: Scapular retraction and external rotation  1. Sit in a stable chair without armrests, or stand. 2. Secure an exercise band to a stable object in front of you so it is at shoulder height. 3. Hold one end of the exercise band in each hand. Your palms should face down. 4. Straighten your elbows and lift your hands up to shoulder height. 5. Step back, away from the secured end of the exercise band, until the band stretches. 6. Bend your elbows and raise your hands up to the height of your head. ? Your palms should face out, in front of you, at the top of the movement. ? Squeeze your shoulder blades together during this movement. 7. Hold for __________ seconds. 8. Slowly straighten your arms to return to the starting position. Repeat __________ times. Complete this exercise __________ times a day. This information is not intended to replace advice given to you by your health care provider. Make sure you discuss any questions you have with your health care provider. Document Released: 11/07/2005 Document Revised: 07/14/2016 Document Reviewed: 10/02/2015 Elsevier Interactive Patient Education  2018 [employer] Inc[employer]  

## 2017-10-26 ENCOUNTER — Telehealth: Payer: Self-pay | Admitting: Family Medicine

## 2017-10-26 NOTE — Telephone Encounter (Signed)
Called pt's workers comp adjustor to see if they are going to cover pt's ortho visit. They stated that they would have to reopen his case because he regular insurance will not cover this.Jeremy Cruz. Sent his referral and office information to their office Fax: [phone]. They the workers comp office stated they will call me when a decision has been made.

## 2017-12-08 ENCOUNTER — Telehealth (INDEPENDENT_AMBULATORY_CARE_PROVIDER_SITE_OTHER): Payer: Self-pay

## 2017-12-08 ENCOUNTER — Ambulatory Visit (INDEPENDENT_AMBULATORY_CARE_PROVIDER_SITE_OTHER): Payer: Self-pay | Admitting: Orthopaedic Surgery

## 2017-12-08 NOTE — Telephone Encounter (Signed)
Faxed 11/25/16, 10/10/16, and 09/29/16 office notes to case mgr per her request Fax#[phone]

## 2017-12-12 ENCOUNTER — Ambulatory Visit (INDEPENDENT_AMBULATORY_CARE_PROVIDER_SITE_OTHER): Payer: Worker's Compensation | Admitting: Orthopaedic Surgery

## 2017-12-12 ENCOUNTER — Ambulatory Visit (INDEPENDENT_AMBULATORY_CARE_PROVIDER_SITE_OTHER): Payer: Self-pay | Admitting: Orthopaedic Surgery

## 2017-12-12 DIAGNOSIS — S61411A Laceration without foreign body of right hand, initial encounter: Secondary | ICD-10-CM | POA: Diagnosis not present

## 2017-12-12 NOTE — Progress Notes (Signed)
Patient comes in for right hand pain and numbness follow-up.  There is confusion as to who he was supposed to follow-up with.  We are finally able to clarify through his case manager that he was supposed to follow-up with Dr. Janee Mornhompson 2 months after his initial consultation with him which the patient never followed through with.  Dr. Janee Mornhompson did not recommend any surgery.  Follow-up with me as needed.

## 2018-02-14 ENCOUNTER — Telehealth: Payer: Self-pay

## 2018-02-14 NOTE — Telephone Encounter (Signed)
Please schedule for OV  Copied from CRM [phone]. Topic: Inquiry >> Feb 14, 2018 10:29 AM Crist InfanteHarrald, Kathy J wrote: Reason for CRM: pt states he is still having pain in his right shoulder, and getting worse, keeps coming back. Pt would like to know if Dr Katrinka BlazingSmith wants to see him, refer him or is it time to get a mri?

## 2018-02-16 ENCOUNTER — Telehealth: Payer: Self-pay | Admitting: Family Medicine

## 2018-02-16 NOTE — Telephone Encounter (Signed)
Per CRM and Dr. Katrinka BlazingSmith, made pt an appt for Monday 4/1. I advised of building number, time and late policy.

## 2018-02-19 ENCOUNTER — Other Ambulatory Visit: Payer: Self-pay

## 2018-02-19 ENCOUNTER — Encounter: Payer: Self-pay | Admitting: Family Medicine

## 2018-02-19 ENCOUNTER — Ambulatory Visit (INDEPENDENT_AMBULATORY_CARE_PROVIDER_SITE_OTHER): Payer: BLUE CROSS/BLUE SHIELD

## 2018-02-19 ENCOUNTER — Ambulatory Visit: Payer: BLUE CROSS/BLUE SHIELD | Admitting: Family Medicine

## 2018-02-19 VITALS — BP 116/72 | HR 99 | Temp 98.0°F | Resp 16 | Ht 68.31 in | Wt 181.0 lb

## 2018-02-19 DIAGNOSIS — R202 Paresthesia of skin: Secondary | ICD-10-CM | POA: Diagnosis not present

## 2018-02-19 DIAGNOSIS — M25511 Pain in right shoulder: Secondary | ICD-10-CM

## 2018-02-19 MED ORDER — CYCLOBENZAPRINE HCL 5 MG PO TABS: 5.0000 mg | ORAL_TABLET | Freq: Three times a day (TID) | ORAL | 1 refills | Status: DC | PRN

## 2018-02-19 NOTE — Progress Notes (Signed)
Subjective:    Patient ID: Jeremy Cruz, male    DOB: Aug 23, 1966, 52 y.o.   MRN: [id number]  02/19/2018  Shoulder Pain (right shoulder pain for months that has become worst since last OV. Pt states he has had no relief )    HPI This 52 y.o. male presents for evaluation of persistent R SHOULDER PAIN.  Management changes made at last visit include the following New onset scapular pain with paresthesias radiate pain down into her right hand.  Onset after trauma to right hand and fall.  Not clear if suffering with neck strain with radicular symptoms versus nerve injury to right hand with radiation up into the forearm.  Obtain cervical spine films to evaluate further.  Duration of symptoms for 1 year.  Will refer to orthopedics for further evaluation due to long duration of symptoms.  Recommend home exercise program to be performed daily.  Have treated patient in the past with anti-inflammatories and muscle relaxers.  Will prescribe prednisone to decrease inflammation along nerve distribution.  Progressively worsening R shoulder pain for the past two years; not sure of trigger.  Not sure if related to injury at work.  Has worsened since November 2018.    DG CERVICAL SPINE COMPLETE FROM 10/16/17: FINDINGS: No fracture. No subluxation. Loss of disc height noted at C5-6 and C6-7. No bony neural foraminal encroachment although there is some left-sided uncinate spurring in the lower cervical levels. No prevertebral soft tissue swelling. IMPRESSION: No acute bony abnormality. Cervical spine MRI may prove helpful to further evaluate.  Update since last visit includes the following: Presenting for four month follow-up; pain located in R shoulder and radiates into R anterior chest.  Reaching over to turn off alarm clock causes pain.  Getting very aggravated. Thinks may need a good massage.  Tension into upper scapular region. Sharp pain and snapped and then felt better; no recurrent snapping or  popping. Referred to ortho.  Dr. Roda Shutters advised patient that pain only runs down not up.   Never had this forearm pain until hand injury.  Workman's comp tried to buy it off.  Wants resolution.  No previous MRI on hand.  Numbness immediately in hand and forearm region.      BP Readings from Last 3 Encounters:  02/19/18 116/72  10/16/17 122/70  08/08/17 115/74   Wt Readings from Last 3 Encounters:  02/19/18 181 lb (82.1 kg)  10/16/17 175 lb 6.4 oz (79.6 kg)  08/08/17 169 lb (76.7 kg)   Immunization History  Administered Date(s) Administered  . Hepatitis A 07/31/2012, 02/04/2013  . Influenza,inj,Quad PF,6+ Mos 08/08/2017  . Tdap 09/19/2016    Review of Systems  Constitutional: Negative for activity change, appetite change, chills, diaphoresis, fatigue and fever.  Eyes: Negative for visual disturbance.  Respiratory: Negative for cough and shortness of breath.   Cardiovascular: Negative for chest pain, palpitations and leg swelling.  Endocrine: Negative for cold intolerance, heat intolerance, polydipsia, polyphagia and polyuria.  Musculoskeletal: Positive for arthralgias, myalgias, neck pain and neck stiffness. Negative for back pain, gait problem and joint swelling.  Neurological: Positive for numbness. Negative for dizziness, tremors, seizures, syncope, facial asymmetry, speech difficulty, weakness, light-headedness and headaches.    Past Medical History:  Diagnosis Date  . Allergy   . GERD (gastroesophageal reflux disease) 04/21/2012   History reviewed. No pertinent surgical history. Allergies  Allergen Reactions  . Asa [Aspirin] Anaphylaxis  . Nsaids Anaphylaxis   Current Outpatient Medications on File Prior to Visit  Medication Sig Dispense Refill  . fexofenadine (ALLEGRA) 180 MG tablet Take 180 mg by mouth daily.    . lansoprazole (PREVACID) 15 MG capsule Take 15 mg by mouth daily.     No current facility-administered medications on file prior to visit.    Social  History   Socioeconomic History  . Marital status: Single    Spouse name: Not on file  . Number of children: Not on file  . Years of education: Not on file  . Highest education level: Not on file  Occupational History  . Not on file  Social Needs  . Financial resource strain: Not on file  . Food insecurity:    Worry: Not on file    Inability: Not on file  . Transportation needs:    Medical: Not on file    Non-medical: Not on file  Tobacco Use  . Smoking status: Former Games developer  . Smokeless tobacco: Never Used  Substance and Sexual Activity  . Alcohol use: Yes    Alcohol/week: 1.2 oz    Types: 2 Cans of beer per week  . Drug use: No  . Sexual activity: Yes  Lifestyle  . Physical activity:    Days per week: Not on file    Minutes per session: Not on file  . Stress: Not on file  Relationships  . Social connections:    Talks on phone: Not on file    Gets together: Not on file    Attends religious service: Not on file    Active member of club or organization: Not on file    Attends meetings of clubs or organizations: Not on file    Relationship status: Not on file  . Intimate partner violence:    Fear of current or ex partner: Not on file    Emotionally abused: Not on file    Physically abused: Not on file    Forced sexual activity: Not on file  Other Topics Concern  . Not on file  Social History Narrative   Marital status: single; +dating seriously same sex partner x 17 years.   Children: none   Living: with same sex partner  X 17 years   Employment:  [employer] x 2000.   Tobacco:  Former smoker; quit age 69.   Alcohol:  Socially Bud Light twice per month 2 beers  Per episode.   Drugs: none   Exercise:  None; walks a lot at work.  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     Sexually active: yes; total 61 male partners.   Seatbelt:  100% of time   Guns:  One unloaded in house.   Sunscreen: yes; SPF 30.   Family History  Problem Relation Age of Onset  .  Cancer Father 79       Esophageal and Prostate  . Stroke Father   . Alcohol abuse Father        Objective:    BP 116/72   Pulse 99   Temp 98 F (36.7 C) (Oral)   Resp 16   Ht 5' 8.31" (1.735 m)   Wt 181 lb (82.1 kg)   SpO2 97%   BMI 27.27 kg/m  Physical Exam  Constitutional: He is oriented to person, place, and time. He appears well-developed and well-nourished. No distress.  HENT:  Head: Normocephalic and atraumatic.  Eyes: Pupils are equal, round, and reactive to light. Conjunctivae and EOM are normal.  Neck: Normal range of motion. Neck supple. Carotid  bruit is not present. No thyromegaly present.  Cardiovascular: Normal rate, regular rhythm, normal heart sounds and intact distal pulses. Exam reveals no gallop and no friction rub.  No murmur heard. Pulmonary/Chest: Effort normal and breath sounds normal. He has no wheezes. He has no rales.  Musculoskeletal:       Right shoulder: He exhibits decreased range of motion and pain. He exhibits no tenderness, no bony tenderness, no spasm, normal pulse and normal strength.       Cervical back: He exhibits tenderness, pain and spasm. He exhibits normal range of motion, no bony tenderness, no swelling, no edema, no deformity, no laceration and normal pulse.  Lymphadenopathy:    He has no cervical adenopathy.  Neurological: He is alert and oriented to person, place, and time. No cranial nerve deficit.  Skin: Skin is warm and dry. No rash noted. He is not diaphoretic.  Psychiatric: He has a normal mood and affect. His behavior is normal.  Nursing note and vitals reviewed.  No results found. Depression screen [facility] 2/9 02/19/2018 10/16/2017 08/08/2017 02/02/2017 09/26/2016  Decreased Interest 0 0 0 0 0  Down, Depressed, Hopeless 0 0 0 0 0  PHQ - 2 Score 0 0 0 0 0   Fall Risk  02/19/2018 10/16/2017 08/08/2017 02/02/2017 09/26/2016  Falls in the past year? No No No No Yes  Number falls in past yr: - - - - 1        Assessment & Plan:   1.  Acute pain of right shoulder   2. Paresthesias in right hand    Right shoulder pain: Worsening since last visit.  Now having painful range of motion of right shoulder.  Obtain right shoulder films.  Home exercise program provided.  Prescription for Flexeril 5 mg 3 times daily provided.  Right hand paresthesias: Persistent.  Onset immediately with puncture wound to the right hand.  Obtain nerve conduction studies to determine pathology.  Status post orthopedic consultation regarding paresthesias in hand, shoulder pain, neck pain.  No further work-up offered thus patient returning to primary care provider for additional work-up.  Orders Placed This Encounter  Procedures  . DG Shoulder Right    Standing Status:   Future    Number of Occurrences:   1    Standing Expiration Date:   02/19/2019    Order Specific Question:   Reason for Exam (SYMPTOM  OR DIAGNOSIS REQUIRED)    Answer:   R shoulder pain for two years with acute worsening in 3 months    Order Specific Question:   Preferred imaging location?    Answer:   External  . Nerve conduction test    Standing Status:   Future    Standing Expiration Date:   02/20/2019    Order Specific Question:   Where should this test be performed?    Answer:   [facility]   Meds ordered this encounter  Medications  . cyclobenzaprine (FLEXERIL) 5 MG tablet    Sig: Take 1 tablet (5 mg total) by mouth 3 (three) times daily as needed for muscle spasms.    Dispense:  30 tablet    Refill:  1    No follow-ups on file.   Linnie Mcglocklin Paulita FujitaMartin Kareena Arrambide, M.D. [facility]  [facility] [address] [place], [place]  [id number] [phone] phone [phone] fax

## 2018-02-19 NOTE — Patient Instructions (Addendum)
IF you received an x-ray today, you will receive an invoice from [facility]. Please contact [facility] at [phone] with questions or concerns regarding your invoice.   IF you received labwork today, you will receive an invoice from [facility]. Please contact [facility] at [phone] with questions or concerns regarding your invoice.   Our billing staff will not be able to assist you with questions regarding bills from these companies.  You will be contacted with the lab results as soon as they are available. The fastest way to get your results is to activate your My Chart account. Instructions are located on the last page of this paperwork. If you have not heard from [place] regarding the results in 2 weeks, please contact this office.      Rotator Cuff Tendonitis Home Exercise Program Ask your health care provider which exercises are safe for you. Do exercises exactly as told by your health care provider and adjust them as directed. It is normal to feel mild stretching, pulling, tightness, or discomfort as you do these exercises, but you should stop right away if you feel sudden pain or your pain gets worse. Do not begin these exercises until told by your health care provider. Stretching and range of motion exercises These exercises warm up your muscles and joints and improve the movement and flexibility of your shoulder. These exercises also help to relieve pain, numbness, and tingling. Exercise A: Pendulum  1. Stand near a wall or a surface that you can hold onto for balance. 2. Bend at the waist and let your left / right arm hang straight down. Use your other arm to keep your balance. 3. Relax your arm and shoulder muscles, and move your hips and your trunk so your left / right arm swings freely. Your arm should swing because of the motion of your body, not because you are using your arm or shoulder muscles. 4. Keep moving so your arm swings in the following  directions, as told by your health care provider: ? Side to side. ? Forward and backward. ? In clockwise and counterclockwise circles. Repeat __________ times, or for __________ seconds per direction. Complete this exercise __________ times a day. Exercise B: Flexion, seated  1. Sit in a stable chair so your left / right forearm can rest on a flat surface. Your elbow should rest at a height that keeps your upper arm next to your body. 2. Keeping your shoulder relaxed, lean forward at the waist and let your hand slide forward. Stop when you feel a stretch in your shoulder, or when you reach the angle that is recommended by your health care provider. 3. Hold for __________ seconds. 4. Slowly return to the starting position. Repeat __________ times. Complete this exercise __________ times a day. Exercise C: Flexion, standing  1. Stand and hold a broomstick, a cane, or a similar object. Place your hands a little more than shoulder-width apart on the object. Your left / right hand should be palm-up, and your other hand should be palm-down. 2. Push the stick down with your healthy arm to raise your left / right arm in front of your body, and then over your head. Use your other hand to help move the stick. Stop when you feel a stretch in your shoulder, or when you reach the angle that is recommended by your health care provider. ? Avoid shrugging your shoulder while you raise your arm. Keep your shoulder blade tucked down toward your spine. ?  Keep your left / right shoulder muscles relaxed. 3. Hold for __________ seconds. 4. Slowly return to the starting position. Repeat __________ times. Complete this exercise __________ times a day. Exercise D: Abduction, supine  1. Lie on your back and hold a broomstick, a cane, or a similar object. Place your hands a little more than shoulder-width apart on the object. Your left / right hand should be palm-up, and your other hand should be palm-down. 2. Push the  stick to raise your left / right arm out to your side and then over your head. Use your other hand to help move the stick. Stop when you feel a stretch in your shoulder, or when you reach the angle that is recommended by your health care provider. ? Avoid shrugging your shoulder while you raise your arm. Keep your shoulder blade tucked down toward your spine. 3. Hold for __________ seconds. 4. Slowly return to the starting position. Repeat __________ times. Complete this exercise __________ times a day. Exercise E: Shoulder flexion, active-assisted  1. Lie on your back. You may bend your knees for comfort. 2. Hold a broomstick, a cane, or a similar object so your hands are about shoulder-width apart. Your palms should face toward your feet. 3. Raise your left / right arm over your head and behind your head, toward the floor. Use your other hand to help you do this. Stop when you feel a gentle stretch in your shoulder, or when you reach the angle that is recommended by your health care provider. 4. Hold for __________ seconds. 5. Use the broomstick and your other arm to help you return your left / right arm to the starting position. Repeat __________ times. Complete this exercise __________ times a day. Exercise F: External rotation  1. Sit in a stable chair without armrests, or stand. 2. Tuck a soft object, such as a folded towel or a small ball, under your left / right upper arm. 3. Hold a broomstick, a cane, or a similar object so your palms face down, toward the floor. Bend your elbows to an "L" shape (90 degrees), and keep your hands about shoulder-width apart. 4. Straighten your healthy arm and push the broomstick across your body, toward your left / right side. Keep your left / right arm bent. This will rotate your left / right forearm away from your body. 5. Hold for __________ seconds. 6. Slowly return to the starting position. Repeat __________ times. Complete this exercise __________  times a day. Strengthening exercises These exercises build strength and endurance in your shoulder. Endurance is the ability to use your muscles for a long time, even after they get tired. Exercise G: Shoulder flexion, isometric  1. Stand or sit about 4-6 inches (10-15 cm) away from a wall with your left / right side facing the wall. 2. Gently make a fist and place your left / right hand on the wall so the top of your fist touches the wall. 3. With your left / right elbow straight, gently press the top of your fist into the wall. Gradually increase the pressure until you are pressing as hard as you can without shrugging your shoulder. 4. Hold for __________ seconds. 5. Slowly release the tension and relax your muscles completely before you repeat the exercise. Repeat __________ times. Complete this exercise __________ times a day. Exercise H: Shoulder abduction, isometric  1. Stand or sit about 4-6 inches (10-15 cm) away from a wall with your right/left side facing the wall.  2. Bend your left / right elbow and gently press your elbow into the wall as if you are trying to move your arm out to your side. Increase the pressure gradually until you are pressing as hard as you can without shrugging your shoulder. 3. Hold for __________ seconds. 4. Slowly release the tension and relax your muscles completely before repeating the exercise. Repeat __________ times. Complete this exercise __________ times a day. Exercise I: Internal rotation, isometric  1. Stand or sit in a doorway, facing the door frame. 2. Bend your left / right elbow and place the palm of your hand against the door frame. Only your palm should be touching the frame. Keep your upper arm at your side. 3. Gently press your hand into the door frame, as if you are trying to push your arm toward your abdomen. Do not let your wrist bend. ? Avoid shrugging your shoulder while you press your hand into the door frame. Keep your shoulder blade  tucked down toward the middle of your back. 4. Hold for __________ seconds. 5. Slowly release the tension, and relax your muscles completely before you repeat the exercise. Repeat __________ times. Complete this exercise __________ times a day. Exercise J: External rotation, isometric  1. Stand or sit in a doorway, facing the door frame. 2. Bend your left / right elbow and place the back of your wrist against the door frame. Only the back of your wrist should be touching the frame. Keep your upper arm at your side. 3. Gently press your wrist against the door frame, as if you are trying to push your arm away from your abdomen. ? Avoid shrugging your shoulder while you press your wrist into the door frame. Keep your shoulder blade tucked down toward the middle of your back. 4. Hold for __________ seconds. 5. Slowly release the tension, and relax your muscles completely before you repeat the exercise. Repeat __________ times. Complete this exercise __________ times a day. This information is not intended to replace advice given to you by your health care provider. Make sure you discuss any questions you have with your health care provider. Document Released: 11/07/2005 Document Revised: 07/14/2016 Document Reviewed: 11/21/2015 Elsevier Interactive Patient Education  [employer].

## 2018-04-02 ENCOUNTER — Ambulatory Visit: Payer: Self-pay | Admitting: Family Medicine

## 2018-04-12 ENCOUNTER — Encounter: Payer: Self-pay | Admitting: Family Medicine

## 2018-04-19 ENCOUNTER — Telehealth: Payer: Self-pay | Admitting: Family Medicine

## 2018-04-19 NOTE — Telephone Encounter (Signed)
I left a voicemail in regards to cancelling/rescheduling his appt on 08/15/2018 with Dr. Katrinka Blazing due to provider leaving.

## 2018-08-15 ENCOUNTER — Encounter: Payer: Self-pay | Admitting: Family Medicine

## 2018-09-13 ENCOUNTER — Ambulatory Visit (INDEPENDENT_AMBULATORY_CARE_PROVIDER_SITE_OTHER)
Admission: RE | Admit: 2018-09-13 | Discharge: 2018-09-13 | Disposition: A | Discharge status: Home / Self Care | Payer: BLUE CROSS/BLUE SHIELD | Source: Ambulatory Visit | Attending: Family Medicine | Admitting: Family Medicine

## 2018-09-13 ENCOUNTER — Encounter: Payer: Self-pay | Admitting: Family Medicine

## 2018-09-13 ENCOUNTER — Ambulatory Visit: Payer: BLUE CROSS/BLUE SHIELD | Admitting: Family Medicine

## 2018-09-13 ENCOUNTER — Ambulatory Visit: Payer: Self-pay

## 2018-09-13 VITALS — BP 104/76 | HR 76 | Ht 68.0 in | Wt 181.0 lb

## 2018-09-13 DIAGNOSIS — G8929 Other chronic pain: Secondary | ICD-10-CM

## 2018-09-13 DIAGNOSIS — M5412 Radiculopathy, cervical region: Secondary | ICD-10-CM

## 2018-09-13 DIAGNOSIS — M47812 Spondylosis without myelopathy or radiculopathy, cervical region: Secondary | ICD-10-CM | POA: Diagnosis not present

## 2018-09-13 DIAGNOSIS — M25511 Pain in right shoulder: Secondary | ICD-10-CM | POA: Diagnosis not present

## 2018-09-13 MED ORDER — TIZANIDINE HCL 4 MG PO TABS: 4.0000 mg | ORAL_TABLET | Freq: Four times a day (QID) | ORAL | 0 refills | Status: DC | PRN

## 2018-09-13 MED ORDER — GABAPENTIN 100 MG PO CAPS: 200.0000 mg | ORAL_CAPSULE | Freq: Every day | ORAL | 3 refills | Status: DC

## 2018-09-13 NOTE — Progress Notes (Signed)
[facility] [address] [place], [place] [address] Phone: [phone] Subjective:   Jeremy Cruz, am serving as a scribe for Dr. Antoine Primas.   CC: Right shoulder pain  BJY:NWGNFAOZHY  Jeremy Cruz is a 52 y.o. male coming in with complaint of right shoulder pain. Pain radiates down into tricep constantly. Pain increases when he puts arm on armrest in the car. Cold weather increased his pain. 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 Has tried TENS unit. Does have numbness in fifth finger.       Past Medical History:  Diagnosis Date  . Allergy   . GERD (gastroesophageal reflux disease) 04/21/2012   No past surgical history on file. Social History   Socioeconomic History  . Marital status: Single    Spouse name: Not on file  . Number of children: Not on file  . Years of education: Not on file  . Highest education level: Not on file  Occupational History  . Not on file  Social Needs  . Financial resource strain: Not on file  . Food insecurity:    Worry: Not on file    Inability: Not on file  . Transportation needs:    Medical: Not on file    Non-medical: Not on file  Tobacco Use  . Smoking status: Former Games developer  . Smokeless tobacco: Never Used  Substance and Sexual Activity  . Alcohol use: Yes    Alcohol/week: 2.0 standard drinks    Types: 2 Cans of beer per week  . Drug use: No  . Sexual activity: Yes  Lifestyle  . Physical activity:    Days per week: Not on file    Minutes per session: Not on file  . Stress: Not on file  Relationships  . Social connections:    Talks on phone: Not on file    Gets together: Not on file    Attends religious service: Not on file    Active member of club or organization: Not on file    Attends meetings of clubs or organizations: Not on file    Relationship status: Not on file  Other Topics Concern  . Not on file  Social History Narrative   Marital status: single; +dating seriously same sex partner x 17 years.   Children: none   Living: with same sex partner  X 17 years   Employment:  [employer] x 2000.   Tobacco:  Former smoker; quit age 49.   Alcohol:  Socially Bud Light twice per month 2 beers  Per episode.   Drugs: none   Exercise:  None; walks a lot at work.  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     Sexually active: yes; total 87 male partners.   Seatbelt:  100% of time   Guns:  One unloaded in house.   Sunscreen: yes; SPF 30.   Allergies  Allergen Reactions  . Asa [Aspirin] Anaphylaxis  . Nsaids Anaphylaxis   Family History  Problem Relation Age of Onset  . Cancer Father 78       Esophageal and Prostate  . Stroke Father   . Alcohol abuse Father       Current Outpatient Medications (Respiratory):  .  fexofenadine (ALLEGRA) 180 MG tablet, Take 180 mg by mouth daily.    Current Outpatient Medications (Other):  .  cyclobenzaprine (FLEXERIL) 5 MG tablet, Take 1 tablet (5 mg  total) by mouth 3 (three) times daily as needed for muscle spasms. .  lansoprazole (PREVACID) 15 MG capsule, Take 15 mg by mouth daily.    Past medical history, social, surgical and family history all reviewed in electronic medical record.  No pertanent information unless stated regarding to the chief complaint.   Review of Systems:  No headache, visual changes, nausea, vomiting, diarrhea, constipation, dizziness, abdominal pain, skin rash, fevers, chills, night sweats, weight loss, swollen lymph nodes, body aches, joint swelling, muscle aches, chest pain, shortness of breath, mood changes.   Objective  There were no vitals taken for this visit. Systems examined below as of    General: No apparent distress alert and oriented x3 mood and affect normal, dressed appropriately.  HEENT: Pupils equal, extraocular movements intact  Respiratory: Patient's speak in full sentences and does not appear short of breath  Cardiovascular:  No lower extremity edema, non tender, no erythema  Skin: Warm dry intact with no signs of infection or rash on extremities or on axial skeleton.  Abdomen: Soft nontender  Neuro: Cranial nerves II through XII are intact, neurovascularly intact in all extremities with 2+ DTRs and 2+ pulses.  Lymph: No lymphadenopathy of posterior or anterior cervical chain or axillae bilaterally.  Gait normal with good balance and coordination.  MSK:  Non tender with full range of motion and good stability and symmetric strength and tone of shoulders, elbows, wrist, hip, knee and ankles bilaterally.  Neck: Inspection patient has actually anterior displacement of the neck.. No palpable stepoffs. Positive Spurling's maneuver. Limited range of motion with very minimal right-sided sidebending and very minimal extension secondary to discomfort and pain. Grip strength and sensation normal in bilateral hands Strength good C4 to T1 distribution No sensory change to C4 to T1 Negative Hoffman sign bilaterally Reflexes normal severe tightness in the right trapezius area  Osteopathic findings T3 extended rotated and side bent right inhaled third rib T9 extended rotated and side bent left   [id number]; 15 additional minutes spent for Therapeutic exercises as stated in above notes.  This included exercises focusing on stretching, strengthening, with significant focus on eccentric aspects.   Long term goals include an improvement in range of motion, strength, endurance as well as avoiding reinjury. Patient's frequency would include in 1-2 times a day, 3-5 times a week for a duration of 6-12 weeks. Exercises that included:  Basic scapular stabilization to include adduction and depression of scapula Scaption, focusing on proper movement and good control Internal and External rotation utilizing a theraband, with elbow tucked at side entire time Rows with theraband   Proper technique shown and discussed handout in great detail  with ATC.  All questions were discussed and answered.     Impression and Recommendations:     This case required medical decision making of moderate complexity. The above documentation has been reviewed and is accurate and complete Wilford Grist       Note: This dictation was prepared with Dragon dictation along with smaller phrase technology. Any transcriptional errors that result from this process are unintentional.

## 2018-09-13 NOTE — Patient Instructions (Signed)
Good to see you  Gabapentin 200mg  at night Zanaflex up to 3 times a day for breakthrough pain but may make yo usleepy  Ice 20 minutes 2 times daily. Usually after activity and before bed. TENS would be fine as well  Xrays downstaIrs today  See me again in 1-2 weeks to make sure better

## 2018-09-13 NOTE — Assessment & Plan Note (Signed)
Cervical radiculopathy.  Patient has no weakness of the upper extremity.  Deep tendon reflexes are intact.  Patient does have limited range of motion and potential positive Spurling's with radicular symptoms of noted on exam.  Patient will have x-rays and flexion-extension.  Likely a herniated disc.  Unable to tolerate prednisone or anti-inflammatories.  Started a muscle relaxer and gabapentin at night.  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  Discussed icing regimen, patient has other different things to change work environment.  Patient will follow-up in 1 to 2 weeks.  Worsening symptoms may need to consider advanced imaging.

## 2018-09-25 NOTE — Progress Notes (Signed)
[facility] [address] [place], [place] [address] Phone: [phone] Subjective:    I Jeremy Cruz am serving as a Neurosurgeon for Dr. Antoine Primas.   CC: Neck pain  BJY:NWGNFAOZHY  Jeremy Cruz is a 52 y.o. male coming in with complaint of neck pain. Would like to know his xray results. Believes the neck has to do with a prior hand injury. Hand injury happened 09/21/15. Triceps pain. Doesn't believe Gabapentin is not helping. Forearm pain.   Patient states that it is severe.  Does feel that the gabapentin did help him the initial night but still not helping as much now.     Past Medical History:  Diagnosis Date  . Allergy   . GERD (gastroesophageal reflux disease) 04/21/2012   No past surgical history on file. Social History   Socioeconomic History  . Marital status: Single    Spouse name: Not on file  . Number of children: Not on file  . Years of education: Not on file  . Highest education level: Not on file  Occupational History  . Not on file  Social Needs  . Financial resource strain: Not on file  . Food insecurity:    Worry: Not on file    Inability: Not on file  . Transportation needs:    Medical: Not on file    Non-medical: Not on file  Tobacco Use  . Smoking status: Former Games developer  . Smokeless tobacco: Never Used  Substance and Sexual Activity  . Alcohol use: Yes    Alcohol/week: 2.0 standard drinks    Types: 2 Cans of beer per week  . Drug use: No  . Sexual activity: Yes  Lifestyle  . Physical activity:    Days per week: Not on file    Minutes per session: Not on file  . Stress: Not on file  Relationships  . Social connections:    Talks on phone: Not on file    Gets together: Not on file    Attends religious service: Not on file    Active member of club or organization: Not on file    Attends meetings of clubs or organizations: Not on file    Relationship status: Not on file  Other Topics Concern  . Not on file    Social History Narrative   Marital status: single; +dating seriously same sex partner x 17 years.   Children: none   Living: with same sex partner  X 17 years   Employment:  [employer] x 2000.   Tobacco:  Former smoker; quit age 74.   Alcohol:  Socially Bud Light twice per month 2 beers  Per episode.   Drugs: none   Exercise:  None; walks a lot at work.  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   Seatbelt:  100% of time   Guns:  One unloaded in house.   Sunscreen: yes; SPF 30.   Allergies  Allergen Reactions  . Asa [Aspirin] Anaphylaxis  . Nsaids Anaphylaxis   Family History  Problem Relation Age of Onset  . Cancer Father 80       Esophageal and Prostate  . Stroke Father   . Alcohol abuse Father       Current Outpatient Medications (Respiratory):  .  fexofenadine (ALLEGRA) 180 MG tablet, Take 180 mg by mouth daily.    Current Outpatient Medications (Other):  .  cyclobenzaprine (  FLEXERIL) 5 MG tablet, Take 1 tablet (5 mg total) by mouth 3 (three) times daily as needed for muscle spasms. [facility] Kitchen  gabapentin (NEURONTIN) 100 MG capsule, Take 2 capsules (200 mg total) by mouth at bedtime. .  lansoprazole (PREVACID) 15 MG capsule, Take 15 mg by mouth daily. [place] Kitchen  tiZANidine (ZANAFLEX) 4 MG tablet, Take 1 tablet (4 mg total) by mouth every 6 (six) hours as needed for muscle spasms.    Past medical history, social, surgical and family history all reviewed in electronic medical record.  No pertanent information unless stated regarding to the chief complaint.   Review of Systems:  No headache, visual changes, nausea, vomiting, diarrhea, constipation, dizziness, abdominal pain, skin rash, fevers, chills, night sweats, weight loss, swollen lymph nodes, body aches, joint swelling,, chest pain, shortness of breath, mood changes.  Positive muscle aches  Objective  Blood pressure 100/70, pulse 87, height 5\' 8"  (1.727 m), weight 184 lb  (83.5 kg), SpO2 97 %.    General: No apparent distress alert and oriented x3 mood and affect normal, dressed appropriately.  HEENT: Pupils equal, extraocular movements intact  Respiratory: Patient's speak in full sentences and does not appear short of breath  Cardiovascular: No lower extremity edema, non tender, no erythema  Skin: Warm dry intact with no signs of infection or rash on extremities or on axial skeleton.  Abdomen: Soft nontender  Neuro: Cranial nerves II through XII are intact, neurovascularly intact in all extremities with 2+ DTRs and 2+ pulses.  Lymph: No lymphadenopathy of posterior or anterior cervical chain or axillae bilaterally.  Gait normal with good balance and coordination.  MSK:  Non tender with full range of motion and good stability and symmetric strength and tone of shoulders, elbows, wrist, hip, knee and ankles bilaterally.  Neck exam shows loss of lordosis.  Decreased range of motion in all planes.  Positive Spurling's noted.  Weakness still noted in the left upper extremity noted which is new.  Deep tendon reflexes are still intact though.    Impression and Recommendations:      The above documentation has been reviewed and is accurate and complete Judi Saa, DO       Note: This dictation was prepared with Dragon dictation along with smaller phrase technology. Any transcriptional errors that result from this process are unintentional.

## 2018-09-26 ENCOUNTER — Ambulatory Visit: Payer: Self-pay | Admitting: Family Medicine

## 2018-09-26 ENCOUNTER — Encounter: Payer: Self-pay | Admitting: Family Medicine

## 2018-09-26 ENCOUNTER — Ambulatory Visit: Payer: BLUE CROSS/BLUE SHIELD | Admitting: Family Medicine

## 2018-09-26 VITALS — BP 100/70 | HR 87 | Ht 68.0 in | Wt 184.0 lb

## 2018-09-26 DIAGNOSIS — M5412 Radiculopathy, cervical region: Secondary | ICD-10-CM

## 2018-09-26 NOTE — Assessment & Plan Note (Signed)
Patient did not respond well to the conservative therapy.  We discussed the muscle relaxer and the gabapentin on a more regular basis.  Patient is having no increasing weakness and I am concerned about.  Patient will have MRI done.  Could be a candidate for potential epidurals.  Patient's x-rays that were independently visualized by me did show the moderate degenerative disc disease at multiple levels especially at the C5-C7 especially on the left side.  Spent  25 minutes with patient face-to-face and had greater than 50% of counseling including as described above in assessment and plan.

## 2018-09-26 NOTE — Patient Instructions (Signed)
Good to see you  MRI of the neck ordered [id number] you can call and schedule the test  We will call you and then if it is possible we will order an epidural.  If you have the injection I will want to see you again 3 weeks after that

## 2018-10-17 ENCOUNTER — Ambulatory Visit
Admission: RE | Admit: 2018-10-17 | Discharge: 2018-10-17 | Disposition: A | Discharge status: Home / Self Care | Payer: BLUE CROSS/BLUE SHIELD | Source: Ambulatory Visit | Attending: Family Medicine | Admitting: Family Medicine

## 2018-10-17 DIAGNOSIS — M5412 Radiculopathy, cervical region: Secondary | ICD-10-CM

## 2018-10-17 DIAGNOSIS — M4802 Spinal stenosis, cervical region: Secondary | ICD-10-CM | POA: Diagnosis not present

## 2018-10-23 ENCOUNTER — Telehealth: Payer: Self-pay

## 2018-10-23 ENCOUNTER — Other Ambulatory Visit: Payer: Self-pay

## 2018-10-23 DIAGNOSIS — G8929 Other chronic pain: Secondary | ICD-10-CM

## 2018-10-23 DIAGNOSIS — M542 Cervicalgia: Principal | ICD-10-CM

## 2018-10-23 NOTE — Telephone Encounter (Signed)
Ordered C7-T1 epidural for patient.

## 2018-10-23 NOTE — Telephone Encounter (Signed)
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 Could try epidural at C7-T1 to see if it would help and if he would like it then please order it  Thank you

## 2018-11-05 ENCOUNTER — Other Ambulatory Visit: Payer: BLUE CROSS/BLUE SHIELD

## 2018-11-06 ENCOUNTER — Other Ambulatory Visit: Payer: Self-pay

## 2018-11-06 ENCOUNTER — Ambulatory Visit: Payer: BLUE CROSS/BLUE SHIELD | Admitting: Family Medicine

## 2018-11-06 ENCOUNTER — Ambulatory Visit (INDEPENDENT_AMBULATORY_CARE_PROVIDER_SITE_OTHER): Payer: BLUE CROSS/BLUE SHIELD

## 2018-11-06 ENCOUNTER — Encounter: Payer: Self-pay | Admitting: Family Medicine

## 2018-11-06 VITALS — BP 113/68 | HR 84 | Temp 97.5°F | Ht 68.0 in | Wt 175.4 lb

## 2018-11-06 DIAGNOSIS — R197 Diarrhea, unspecified: Secondary | ICD-10-CM

## 2018-11-06 DIAGNOSIS — Z1211 Encounter for screening for malignant neoplasm of colon: Secondary | ICD-10-CM

## 2018-11-06 DIAGNOSIS — R1010 Upper abdominal pain, unspecified: Secondary | ICD-10-CM

## 2018-11-06 DIAGNOSIS — R140 Abdominal distension (gaseous): Secondary | ICD-10-CM

## 2018-11-06 DIAGNOSIS — K30 Functional dyspepsia: Secondary | ICD-10-CM | POA: Diagnosis not present

## 2018-11-06 DIAGNOSIS — Z113 Encounter for screening for infections with a predominantly sexual mode of transmission: Secondary | ICD-10-CM | POA: Diagnosis not present

## 2018-11-06 DIAGNOSIS — R634 Abnormal weight loss: Secondary | ICD-10-CM

## 2018-11-06 DIAGNOSIS — R112 Nausea with vomiting, unspecified: Secondary | ICD-10-CM

## 2018-11-06 LAB — POCT URINALYSIS DIP (MANUAL ENTRY)
Bilirubin, UA: NEGATIVE
Blood, UA: NEGATIVE
Glucose, UA: NEGATIVE mg/dL
Ketones, POC UA: NEGATIVE mg/dL
Leukocytes, UA: NEGATIVE
Nitrite, UA: NEGATIVE
Protein Ur, POC: NEGATIVE mg/dL
Spec Grav, UA: <=1.005 — AB (ref 1.010–1.025)
Urobilinogen, UA: 0.2 E.U./dL
pH, UA: 6 (ref 5.0–8.0)

## 2018-11-06 LAB — POCT CBC
Granulocyte percent: 76 %G (ref 37–80)
HCT, POC: 44.6 % — AB (ref 29–41)
Hemoglobin: 15.2 g/dL — AB (ref 11–14.6)
Lymph, poc: 1.7 (ref 0.6–3.4)
MCH, POC: 29.7 pg (ref 27–31.2)
MCHC: 34 g/dL (ref 31.8–35.4)
MCV: 87.3 fL (ref 76–111)
MID (cbc): 0.8 (ref 0–0.9)
MPV: 7 fL (ref 0–99.8)
POC Granulocyte: 7.9 — AB (ref 2–6.9)
POC LYMPH PERCENT: 16.3 %L (ref 10–50)
POC MID %: 7.7 %M (ref 0–12)
Platelet Count, POC: 449 10*3/uL — AB (ref 142–424)
RBC: 5.11 M/uL (ref 4.69–6.13)
RDW, POC: 12.8 %
WBC: 10.4 10*3/uL — AB (ref 4.6–10.2)

## 2018-11-06 NOTE — Patient Instructions (Addendum)
  You have a CT scheduled at [facility] 11/07/2018 at 7:30.  Please arrive by 7:15.  You will go to the main entrance at [facility] and ask the front desk to direct you to the radiology department.  Please stop at the same location this evening to obtain a prep kit.  Do not eat or drink anything after midnight.  After the exam is complete, you do not need to stay.  Your provider will be notified of the results and you will be contacted.     If you have lab work done today you will be contacted with your lab results within the next 2 weeks.  If you have not heard from [place] then please contact us. The fastest way to get your results is to register for My Chart.   IF you received an x-ray today, you will receive an invoice from [facility]. Please contact [facility] at [phone] with questions or concerns regarding your invoice.   IF you received labwork today, you will receive an invoice from [facility]. Please contact [facility] at [phone] with questions or concerns regarding your invoice.   Our billing staff will not be able to assist you with questions regarding bills from these companies.  You will be contacted with the lab results as soon as they are available. The fastest way to get your results is to activate your My Chart account. Instructions are located on the last page of this paperwork. If you have not heard from [place] regarding the results in 2 weeks, please contact this office.

## 2018-11-06 NOTE — Progress Notes (Signed)
12/17/20193:08 PM  Jeremy Cruz 11-03-1966, 52 y.o. male [id number]  Chief Complaint  Patient presents with  . Bloated    food is not disgusting, stomach feels really bloated, making himself vomit for relief. Has been feeling this way for 2 wks now.     HPI:   Patient is a 52 y.o. male with past medical history significant for GERD who presents today for upset stomach  Has been having several of upset stomach Has lost about 10 lbs  Unable to eat much due to significant bloating, will provoke vomiting to feel better, undigested food, mostly meats, no blood, no coffee ground Diarrhea, today has had 3 episodes, light in color Having abdominal cramping No fever or chills H/o ulcer when he was very young, takes prevacid Only morning coffee, usually would drink all morning along No ASA or NSAIDs Rare etoh use No sick contacts  Has never had egd done Has completed hep A vaccines, he is a Nutritional therapist  Requesting STD testing  Fall Risk  11/06/2018 02/19/2018 10/16/2017 08/08/2017 02/02/2017  Falls in the past year? 0 No No No No  Number falls in past yr: - - - - -     Depression screen [facility] 2/9 11/06/2018 02/19/2018 10/16/2017  Decreased Interest 0 0 0  Down, Depressed, Hopeless 0 0 0  PHQ - 2 Score 0 0 0    Allergies  Allergen Reactions  . Asa [Aspirin] Anaphylaxis  . Nsaids Anaphylaxis    Prior to Admission medications   Medication Sig Start Date End Date Taking? Authorizing Provider  lansoprazole (PREVACID) 15 MG capsule Take 15 mg by mouth daily.   Yes [provider]  cyclobenzaprine (FLEXERIL) 5 MG tablet Take 1 tablet (5 mg total) by mouth 3 (three) times daily as needed for muscle spasms. Patient not taking: Reported on 11/06/2018 02/19/18   Ethelda Chick, MD  fexofenadine (ALLEGRA) 180 MG tablet Take 180 mg by mouth daily.    [provider]  gabapentin (NEURONTIN) 100 MG capsule Take 2 capsules (200 mg total) by mouth at bedtime. Patient not  taking: Reported on 11/06/2018 09/13/18   Judi Saa, DO  tiZANidine (ZANAFLEX) 4 MG tablet Take 1 tablet (4 mg total) by mouth every 6 (six) hours as needed for muscle spasms. Patient not taking: Reported on 11/06/2018 09/13/18   Judi Saa, DO    Past Medical History:  Diagnosis Date  . Allergy   . GERD (gastroesophageal reflux disease) 04/21/2012    History reviewed. No pertinent surgical history.  Social History   Tobacco Use  . Smoking status: Former Games developer  . Smokeless tobacco: Never Used  Substance Use Topics  . Alcohol use: Yes    Alcohol/week: 2.0 standard drinks    Types: 2 Cans of beer per week    Family History  Problem Relation Age of Onset  . Cancer Father 60       Esophageal and Prostate  . Stroke Father   . Alcohol abuse Father     ROS Per hpi  OBJECTIVE:  Blood pressure 113/68, pulse 84, temperature (!) 97.5 F (36.4 C), temperature source Oral, height 5\' 8"  (1.727 m), weight 175 lb 6.4 oz (79.6 kg), SpO2 97 %. Body mass index is 26.67 kg/m.   Wt Readings from Last 3 Encounters:  11/06/18 175 lb 6.4 oz (79.6 kg)  09/26/18 184 lb (83.5 kg)  09/13/18 181 lb (82.1 kg)    Physical Exam Vitals signs and nursing note reviewed.  Constitutional:      Appearance: He is well-developed.  HENT:     Head: Normocephalic and atraumatic.  Eyes:     Extraocular Movements: Extraocular movements intact.     Conjunctiva/sclera: Conjunctivae normal.     Pupils: Pupils are equal, round, and reactive to light.     Comments: Not icteric   Neck:     Musculoskeletal: Neck supple.  Cardiovascular:     Rate and Rhythm: Normal rate and regular rhythm.     Heart sounds: No murmur. No friction rub. No gallop.   Pulmonary:     Effort: Pulmonary effort is normal.     Breath sounds: Normal breath sounds. No wheezing or rales.  Abdominal:     General: Bowel sounds are normal. There is no distension.     Palpations: Abdomen is soft.     Tenderness: There  is abdominal tenderness in the right upper quadrant, epigastric area and left upper quadrant. There is no guarding or rebound. Negative signs include Murphy's sign.  Skin:    General: Skin is warm and dry.  Neurological:     Mental Status: He is alert and oriented to person, place, and time.     Results for orders placed or performed in visit on 11/06/18 (from the past 24 hour(s))  POCT urinalysis dipstick     Status: Abnormal   Collection Time: 11/06/18  3:12 PM  Result Value Ref Range   Color, UA yellow yellow   Clarity, UA clear clear   Glucose, UA negative negative mg/dL   Bilirubin, UA negative negative   Ketones, POC UA negative negative mg/dL   Spec Grav, UA <=4.098<=1.005 (A) 1.010 - 1.025   Blood, UA negative negative   pH, UA 6.0 5.0 - 8.0   Protein Ur, POC negative negative mg/dL   Urobilinogen, UA 0.2 0.2 or 1.0 E.U./dL   Nitrite, UA Negative Negative   Leukocytes, UA Negative Negative  POCT CBC     Status: Abnormal   Collection Time: 11/06/18  3:32 PM  Result Value Ref Range   WBC 10.4 (A) 4.6 - 10.2 K/uL   Lymph, poc 1.7 0.6 - 3.4   POC LYMPH PERCENT 16.3 10 - 50 %L   MID (cbc) 0.8 0 - 0.9   POC MID % 7.7 0 - 12 %M   POC Granulocyte 7.9 (A) 2 - 6.9   Granulocyte percent 76.0 37 - 80 %G   RBC 5.11 4.69 - 6.13 M/uL   Hemoglobin 15.2 (A) 11 - 14.6 g/dL   HCT, POC 11.944.6 (A) 29 - 41 %   MCV 87.3 76 - 111 fL   MCH, POC 29.7 27 - 31.2 pg   MCHC 34.0 31.8 - 35.4 g/dL   RDW, POC 14.712.8 %   Platelet Count, POC 449 (A) 142 - 424 K/uL   MPV 7.0 0 - 99.8 fL    Dg Abd 2 Views  Result Date: 11/06/2018 CLINICAL DATA:  Bloating and diarrhea.  Weight loss EXAM: ABDOMEN - 2 VIEW COMPARISON:  None. FINDINGS: Supine and upright images obtained. There is moderate stool in the colon. There is no bowel dilatation or air-fluid level to suggest bowel obstruction. No free air. There is an apparent phlebolith in the lower left pelvis. Lung bases are clear. IMPRESSION: Moderate stool in colon.  No evident bowel obstruction or free air. Lung bases clear. Electronically Signed   By: Bretta BangWilliam  Woodruff III M.D.   On: 11/06/2018 16:37     ASSESSMENT  and PLAN  1. Upper abdominal pain Patient with normal vitals. Concerning for 10 lbs weight loss and pale stools for past 2 weeks. Constipation would not cause sx of concern. Labs and CT scan pending. ER precautions given. - POCT urinalysis dipstick - Comprehensive metabolic panel - POCT CBC - Lipase - Amylase - DG Abd 2 Views; Future  2. Diarrhea, unspecified type - DG Abd 2 Views; Future  3. Abnormal weight loss - CT ABDOMEN PELVIS W CONTRAST; Future  4. Abdominal distension (gaseous) - CT ABDOMEN PELVIS W CONTRAST; Future  5. Intractable vomiting with nausea, unspecified vomiting type - CT ABDOMEN PELVIS W CONTRAST; Future  6. Screening for colon cancer - Cologuard  7. Screening for STD (sexually transmitted disease) - RPR - HIV Antibody (routine testing w rflx) - GC/Chlamydia Probe Amp([facility]) - Hepatitis C antibody   Return if symptoms worsen or fail to improve, for pending results.    Myles Lipps, MD Primary Care at [facility] [address] [place], [place] [address] Ph.  [phone] Fax [phone]

## 2018-11-07 ENCOUNTER — Telehealth: Payer: Self-pay | Admitting: Family Medicine

## 2018-11-07 ENCOUNTER — Telehealth: Payer: Self-pay

## 2018-11-07 ENCOUNTER — Ambulatory Visit (HOSPITAL_COMMUNITY)
Admission: RE | Admit: 2018-11-07 | Discharge: 2018-11-07 | Disposition: A | Discharge status: Home / Self Care | Payer: BLUE CROSS/BLUE SHIELD | Source: Ambulatory Visit | Attending: Family Medicine | Admitting: Family Medicine

## 2018-11-07 ENCOUNTER — Encounter (HOSPITAL_COMMUNITY): Payer: Self-pay

## 2018-11-07 DIAGNOSIS — R634 Abnormal weight loss: Secondary | ICD-10-CM | POA: Diagnosis not present

## 2018-11-07 DIAGNOSIS — R140 Abdominal distension (gaseous): Secondary | ICD-10-CM | POA: Diagnosis not present

## 2018-11-07 DIAGNOSIS — R112 Nausea with vomiting, unspecified: Secondary | ICD-10-CM | POA: Diagnosis not present

## 2018-11-07 DIAGNOSIS — R197 Diarrhea, unspecified: Secondary | ICD-10-CM | POA: Diagnosis not present

## 2018-11-07 LAB — COMPREHENSIVE METABOLIC PANEL
ALT: 11 IU/L (ref 0–44)
AST: 14 IU/L (ref 0–40)
Albumin/Globulin Ratio: 1.7 (ref 1.2–2.2)
Albumin: 4.3 g/dL (ref 3.5–5.5)
Alkaline Phosphatase: 79 IU/L (ref 39–117)
BUN/Creatinine Ratio: 9 (ref 9–20)
BUN: 8 mg/dL (ref 6–24)
Bilirubin Total: 0.6 mg/dL (ref 0.0–1.2)
CO2: 25 mmol/L (ref 20–29)
Calcium: 9.4 mg/dL (ref 8.7–10.2)
Chloride: 102 mmol/L (ref 96–106)
Creatinine, Ser: 0.92 mg/dL (ref 0.76–1.27)
GFR calc Af Amer: 112 mL/min/{1.73_m2} (ref >59)
GFR calc non Af Amer: 97 mL/min/{1.73_m2} (ref >59)
Globulin, Total: 2.5 g/dL (ref 1.5–4.5)
Glucose: 86 mg/dL (ref 65–99)
Potassium: 4.1 mmol/L (ref 3.5–5.2)
Sodium: 140 mmol/L (ref 134–144)
Total Protein: 6.8 g/dL (ref 6.0–8.5)

## 2018-11-07 LAB — AMYLASE: Amylase: 38 U/L (ref 31–124)

## 2018-11-07 LAB — LIPASE: Lipase: 27 U/L (ref 13–78)

## 2018-11-07 LAB — HEPATITIS C ANTIBODY: Hep C Virus Ab: <0.1 s/co ratio (ref 0.0–0.9)

## 2018-11-07 LAB — RPR: RPR Ser Ql: NONREACTIVE

## 2018-11-07 LAB — HIV ANTIBODY (ROUTINE TESTING W REFLEX): HIV Screen 4th Generation wRfx: NONREACTIVE

## 2018-11-07 MED ORDER — SODIUM CHLORIDE (PF) 0.9 % IJ SOLN
INTRAMUSCULAR | Status: AC
  Filled 2018-11-07: qty 50

## 2018-11-07 MED ORDER — IOHEXOL 300 MG/ML  SOLN
100.0000 mL | Freq: Once | INTRAMUSCULAR | Status: AC | PRN
  Administered 2018-11-07: 100 mL via INTRAVENOUS

## 2018-11-07 NOTE — Telephone Encounter (Signed)
I talked with pt to let him know that his labs are normal. Pt will come by the office today to pick up stool cx

## 2018-11-07 NOTE — Telephone Encounter (Signed)
Copied from CRM [id number]. Topic: General - Inquiry >> Nov 07, 2018  9:54 AM Windy KalataMichael, Taylor L, NT wrote: Reason for CRM: [facility] is calling to inform the office that the patients CT report is in epic.

## 2018-11-08 ENCOUNTER — Ambulatory Visit
Admission: RE | Admit: 2018-11-08 | Discharge: 2018-11-08 | Disposition: A | Discharge status: Home / Self Care | Payer: BLUE CROSS/BLUE SHIELD | Source: Ambulatory Visit | Attending: Family Medicine | Admitting: Family Medicine

## 2018-11-08 DIAGNOSIS — G8929 Other chronic pain: Secondary | ICD-10-CM

## 2018-11-08 DIAGNOSIS — M542 Cervicalgia: Principal | ICD-10-CM

## 2018-11-08 DIAGNOSIS — R197 Diarrhea, unspecified: Secondary | ICD-10-CM | POA: Diagnosis not present

## 2018-11-08 LAB — GC/CHLAMYDIA PROBE AMP
Chlamydia trachomatis, NAA: NEGATIVE
Neisseria gonorrhoeae by PCR: NEGATIVE

## 2018-11-08 MED ORDER — IOPAMIDOL (ISOVUE-M 300) INJECTION 61%: 1.0000 mL | Freq: Once | INTRAMUSCULAR | Status: DC | PRN

## 2018-11-08 MED ORDER — TRIAMCINOLONE ACETONIDE 40 MG/ML IJ SUSP (RADIOLOGY): 60.0000 mg | Freq: Once | INTRAMUSCULAR | Status: DC

## 2018-11-09 ENCOUNTER — Other Ambulatory Visit: Payer: Self-pay | Admitting: Family Medicine

## 2018-11-09 DIAGNOSIS — Z800 Family history of malignant neoplasm of digestive organs: Secondary | ICD-10-CM

## 2018-11-09 DIAGNOSIS — Z1211 Encounter for screening for malignant neoplasm of colon: Secondary | ICD-10-CM

## 2018-11-09 DIAGNOSIS — K529 Noninfective gastroenteritis and colitis, unspecified: Secondary | ICD-10-CM

## 2018-11-09 NOTE — Progress Notes (Signed)
Spoke with patient Brought in stool  Feeling the same Reports several members of paternal side of family with colon cancer

## 2018-11-12 LAB — STOOL CULTURE: E coli, Shiga toxin Assay: NEGATIVE

## 2018-11-13 ENCOUNTER — Ambulatory Visit: Payer: Self-pay

## 2018-11-13 NOTE — Telephone Encounter (Signed)
Pt. Has had on-going abdominal issues and has seen his provider for this. Still having bloating and loose stools. Feels "a little better day." Wants to know if he can try Pepto. Instructed he can.Pt. is currently traveling. Will call back as needed.  Answer Assessment - Initial Assessment Questions 1. DIARRHEA SEVERITY: "How bad is the diarrhea?" "How many extra stools have you had in the past 24 hours than normal?"    - NO DIARRHEA (SCALE 0)   - MILD (SCALE 1-3): Few loose or mushy BMs; increase of 1-3 stools over normal daily number of stools; mild increase in ostomy output.   -  MODERATE (SCALE 4-7): Increase of 4-6 stools daily over normal; moderate increase in ostomy output. * SEVERE (SCALE 8-10; OR 'WORST POSSIBLE'): Increase of 7 or more stools daily over normal; moderate increase in ostomy output; incontinence.     Cramping 2. ONSET: "When did the diarrhea begin?"      Started x 1 month 3. BM CONSISTENCY: "How loose or watery is the diarrhea?"       Loose 4. VOMITING: "Are you also vomiting?" If so, ask: "How many times in the past 24 hours?"      No 5. ABDOMINAL PAIN: "Are you having any abdominal pain?" If yes: "What does it feel like?" (e.g., crampy, dull, intermittent, constant)      Above the belly button - Cramping 6. ABDOMINAL PAIN SEVERITY: If present, ask: "How bad is the pain?"  (e.g., Scale 1-10; mild, moderate, or severe)   - MILD (1-3): doesn't interfere with normal activities, abdomen soft and not tender to touch    - MODERATE (4-7): interferes with normal activities or awakens from sleep, tender to touch    - SEVERE (8-10): excruciating pain, doubled over, unable to do any normal activities       5 7. ORAL INTAKE: If vomiting, "Have you been able to drink liquids?" "How much fluids have you had in the past 24 hours?"     Increased his water intake. 8. HYDRATION: "Any signs of dehydration?" (e.g., dry mouth [not just dry lips], too weak to stand, dizziness, new weight  loss) "When did you last urinate?"     No 9. EXPOSURE: "Have you traveled to a foreign country recently?" "Have you been exposed to anyone with diarrhea?" "Could you have eaten any food that was spoiled?"     Unsure 10. ANTIBIOTIC USE: "Are you taking antibiotics now or have you taken antibiotics in the past 2 months?"       No 11. OTHER SYMPTOMS: "Do you have any other symptoms?" (e.g., fever, blood in stool)       No 12. PREGNANCY: "Is there any chance you are pregnant?" "When was your last menstrual period?"       n/a  Protocols used: DIARRHEA-A-AH

## 2018-11-15 LAB — OVA AND PARASITE EXAMINATION

## 2018-12-03 ENCOUNTER — Encounter: Payer: Self-pay | Admitting: Gastroenterology

## 2019-01-02 ENCOUNTER — Ambulatory Visit (AMBULATORY_SURGERY_CENTER): Payer: Self-pay | Admitting: *Deleted

## 2019-01-02 ENCOUNTER — Encounter: Payer: Self-pay | Admitting: Gastroenterology

## 2019-01-02 ENCOUNTER — Other Ambulatory Visit: Payer: Self-pay | Admitting: Gastroenterology

## 2019-01-02 ENCOUNTER — Other Ambulatory Visit: Payer: Self-pay

## 2019-01-02 VITALS — Ht 68.0 in | Wt 185.6 lb

## 2019-01-02 DIAGNOSIS — Z800 Family history of malignant neoplasm of digestive organs: Secondary | ICD-10-CM

## 2019-01-02 MED ORDER — SUPREP BOWEL PREP KIT 17.5-3.13-1.6 GM/177ML PO SOLN: 1.0000 | Freq: Once | ORAL | 0 refills | Status: AC

## 2019-01-02 NOTE — Progress Notes (Signed)
No egg or soy allergy known to patient  No issues with past sedation with any surgeries  or procedures, no intubation problems  No diet pills per patient No home 02 use per patient  No blood thinners per patient  Pt denies issues with constipation  No A fib or A flutter  EMMI video sent to pt's e mail  After assessment patient stating he doesw use Metamucil every day and Miralax as needed. Patient aware to stop Metamucil 5 days prior and will take Miralax qd starting 01/11/19 until procedure.

## 2019-01-08 ENCOUNTER — Telehealth: Payer: Self-pay | Admitting: Gastroenterology

## 2019-01-08 NOTE — Telephone Encounter (Signed)
Patient does not know the cost of Suprep. Patient has not been to his pharmacy yet to get the Suprep. So I asked him to go by his pharmacy and give them the coupon to see how much this will cost and to call us back if too expensive.

## 2019-01-09 NOTE — Telephone Encounter (Signed)
Suprep not cov by ins.  Insurance will [employer] free for pt.

## 2019-01-10 NOTE — Telephone Encounter (Signed)
Pt called back in wanting to know if he can get the samples for the prep.

## 2019-01-11 NOTE — Telephone Encounter (Signed)
Called patient, no answer, left message for the patient to come to 3rd floor to pick up sample of suprep ( lot# [id number], exp. 11/21) 1 kit take as directed.

## 2019-01-16 ENCOUNTER — Ambulatory Visit (AMBULATORY_SURGERY_CENTER): Payer: BLUE CROSS/BLUE SHIELD | Admitting: Gastroenterology

## 2019-01-16 ENCOUNTER — Encounter: Payer: Self-pay | Admitting: Gastroenterology

## 2019-01-16 VITALS — BP 115/77 | HR 64 | Temp 97.1°F | Resp 14 | Ht 68.0 in | Wt 185.0 lb

## 2019-01-16 DIAGNOSIS — Z1211 Encounter for screening for malignant neoplasm of colon: Secondary | ICD-10-CM

## 2019-01-16 DIAGNOSIS — Z800 Family history of malignant neoplasm of digestive organs: Secondary | ICD-10-CM | POA: Diagnosis not present

## 2019-01-16 MED ORDER — SODIUM CHLORIDE 0.9 % IV SOLN: 500.0000 mL | Freq: Once | INTRAVENOUS | Status: DC

## 2019-01-16 NOTE — Progress Notes (Signed)
Pt's states no medical or surgical changes since previsit or office visit. 

## 2019-01-16 NOTE — Progress Notes (Signed)
To PACU, VSS. Report to [email or web address] 

## 2019-01-16 NOTE — Patient Instructions (Signed)
Hand out on hemorrhoids given  YOU HAD AN ENDOSCOPIC PROCEDURE TODAY AT THE [facility]:   Refer to the procedure report that was given to you for any specific questions about what was found during the examination.  If the procedure report does not answer your questions, please call your gastroenterologist to clarify.  If you requested that your care partner not be given the details of your procedure findings, then the procedure report has been included in a sealed envelope for you to review at your convenience later.  YOU SHOULD EXPECT: Some feelings of bloating in the abdomen. Passage of more gas than usual.  Walking can help get rid of the air that was put into your GI tract during the procedure and reduce the bloating. If you had a lower endoscopy (such as a colonoscopy or flexible sigmoidoscopy) you may notice spotting of blood in your stool or on the toilet paper. If you underwent a bowel prep for your procedure, you may not have a normal bowel movement for a few days.  Please Note:  You might notice some irritation and congestion in your nose or some drainage.  This is from the oxygen used during your procedure.  There is no need for concern and it should clear up in a day or so.  SYMPTOMS TO REPORT IMMEDIATELY:   Following lower endoscopy (colonoscopy or flexible sigmoidoscopy):  Excessive amounts of blood in the stool  Significant tenderness or worsening of abdominal pains  Swelling of the abdomen that is new, acute  Fever of 100F or higher   For urgent or emergent issues, a gastroenterologist can be reached at any hour by calling [phone].   DIET:  We do recommend a small meal at first, but then you may proceed to your regular diet.  Drink plenty of fluids but you should avoid alcoholic beverages for 24 hours.  ACTIVITY:  You should plan to take it easy for the rest of today and you should NOT DRIVE or use heavy machinery until tomorrow (because of the sedation  medicines used during the test).    FOLLOW UP: Our staff will call the number listed on your records the next business day following your procedure to check on you and address any questions or concerns that you may have regarding the information given to you following your procedure. If we do not reach you, we will leave a message.  However, if you are feeling well and you are not experiencing any problems, there is no need to return our call.  We will assume that you have returned to your regular daily activities without incident.  If any biopsies were taken you will be contacted by phone or by letter within the next 1-3 weeks.  Please call us at [phone] if you have not heard about the biopsies in 3 weeks.    SIGNATURES/CONFIDENTIALITY: You and/or your care partner have signed paperwork which will be entered into your electronic medical record.  These signatures attest to the fact that that the information above on your After Visit Summary has been reviewed and is understood.  Full responsibility of the confidentiality of this discharge information lies with you and/or your care-partner. 

## 2019-01-16 NOTE — Op Note (Signed)
[facility] Patient Name: Jeremy Cruz Procedure Date: 01/16/2019 10:31 AM MRN: [id number] Endoscopist: Meryl Dare , MD Age: 53 Referring MD:  Date of Birth: Jun 05, 1966 Gender: Male Account #: [id number] Procedure:                Colonoscopy Indications:              Colon cancer screening in patient at increased                            risk: Family history of colorectal cancer in                            multiple 2nd degree relatives Medicines:                Monitored Anesthesia Care Procedure:                Pre-Anesthesia Assessment:                           - Prior to the procedure, a History and Physical                            was performed, and patient medications and                            allergies were reviewed. The patient's tolerance of                            previous anesthesia was also reviewed. The risks                            and benefits of the procedure and the sedation                            options and risks were discussed with the patient.                            All questions were answered, and informed consent                            was obtained. Prior Anticoagulants: The patient has                            taken no previous anticoagulant or antiplatelet                            agents. ASA Grade Assessment: II - A patient with                            mild systemic disease. After reviewing the risks                            and benefits, the patient was deemed in  satisfactory condition to undergo the procedure.                           After obtaining informed consent, the colonoscope                            was passed under direct vision. Throughout the                            procedure, the patient's blood pressure, pulse, and                            oxygen saturations were monitored continuously. The                            Colonoscope was introduced through  the anus and                            advanced to the the cecum, identified by                            appendiceal orifice and ileocecal valve. The                            ileocecal valve, appendiceal orifice, and rectum                            were photographed. The quality of the bowel                            preparation was good. The colonoscopy was performed                            without difficulty. The patient tolerated the                            procedure well. Scope In: 10:48:33 AM Scope Out: 10:57:49 AM Scope Withdrawal Time: 0 hours 7 minutes 45 seconds  Total Procedure Duration: 0 hours 9 minutes 16 seconds  Findings:                 The perianal and digital rectal examinations were                            normal.                           Internal hemorrhoids were found during                            retroflexion. The hemorrhoids were small and Grade                            I (internal hemorrhoids that do not prolapse).  The exam was otherwise without abnormality on                            direct and retroflexion views. Complications:            No immediate complications. Estimated blood loss:                            None. Estimated Blood Loss:     Estimated blood loss: none. Impression:               - Internal hemorrhoids.                           - The examination was otherwise normal on direct                            and retroflexion views.                           - No specimens collected. Recommendation:           - Repeat colonoscopy in 5 years for screening                            purposes.                           - Patient has a contact number available for                            emergencies. The signs and symptoms of potential                            delayed complications were discussed with the                            patient. Return to normal activities tomorrow.                             Written discharge instructions were provided to the                            patient.                           - Resume previous diet.                           - Continue present medications. Meryl Dare, MD 01/16/2019 11:00:19 AM This report has been signed electronically.

## 2019-01-17 ENCOUNTER — Telehealth: Payer: Self-pay | Admitting: *Deleted

## 2019-01-17 NOTE — Telephone Encounter (Signed)
  Follow up Call-  Call back number 01/16/2019  Post procedure Call Back phone  # [phone]  Permission to leave phone message Yes  Some recent data might be hidden     Patient questions:  Do you have a fever, pain , or abdominal swelling? No. Pain Score  0 *  Have you tolerated food without any problems? Yes.    Have you been able to return to your normal activities? Yes.    Do you have any questions about your discharge instructions: Diet   No. Medications  No. Follow up visit  No.  Do you have questions or concerns about your Care? No.  Actions: * If pain score is 4 or above: No action needed, pain <4.

## 2019-02-05 ENCOUNTER — Encounter: Payer: Self-pay | Admitting: Family Medicine

## 2020-11-30 IMAGING — CT CT ABD-PELV W/ CM
2 of 5 series · 16 of 46 positions shown, 18 images · IV contrast (APPLIED)
Comparison: None.

CLINICAL DATA: Abdominal bloating with weight loss. Nausea and
diarrhea.

EXAM:
CT ABDOMEN AND PELVIS WITH CONTRAST
TECHNIQUE: Multidetector CT imaging of the abdomen and pelvis was performed
using the standard protocol following bolus administration of
intravenous contrast. Oral contrast was also administered.
CONTRAST:  100mL OMNIPAQUE IOHEXOL 300 MG/ML  SOLN

[Series 2: axial st · axial · 0.70mm/px · z∈[-498,-98]mm · 13 of 94 slices shown, 15 images]
[im 7/94  soft-tissue]
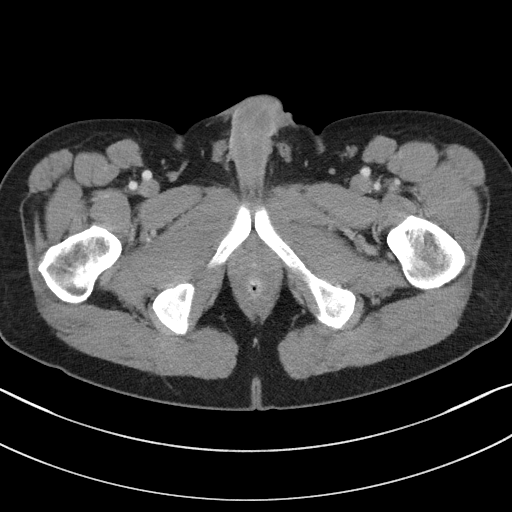
[im 7/94  bone]
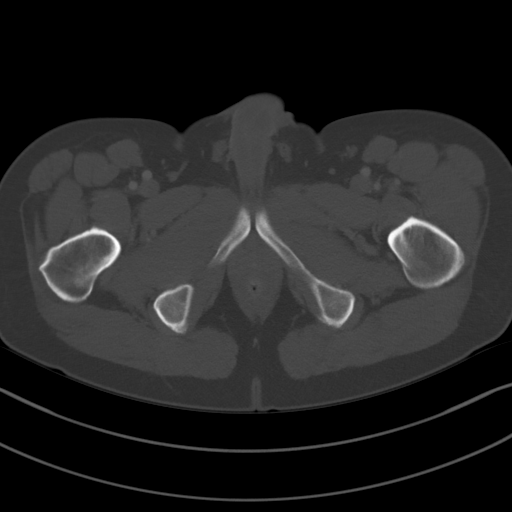
[im 13/94  soft-tissue]
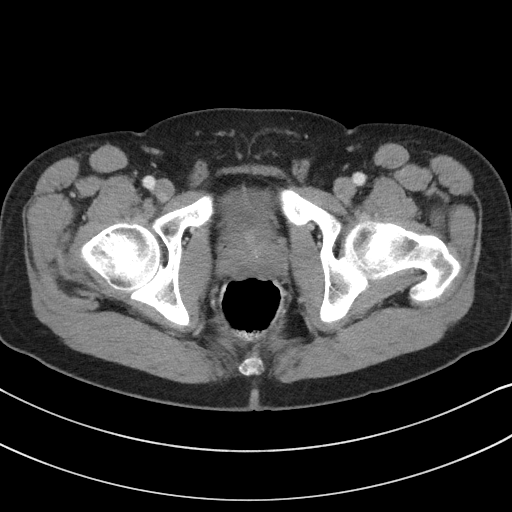
[im 19/94  soft-tissue]
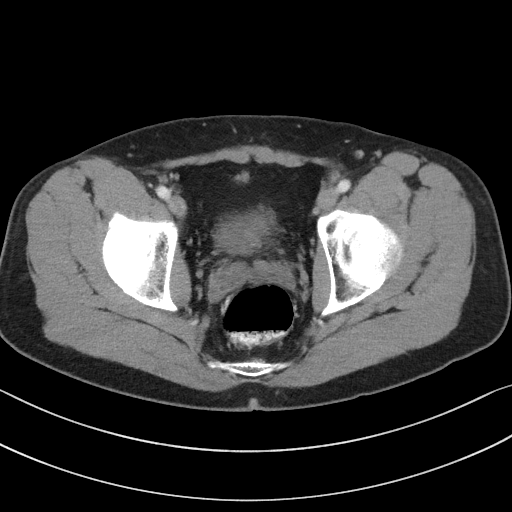
[im 25/94  soft-tissue]
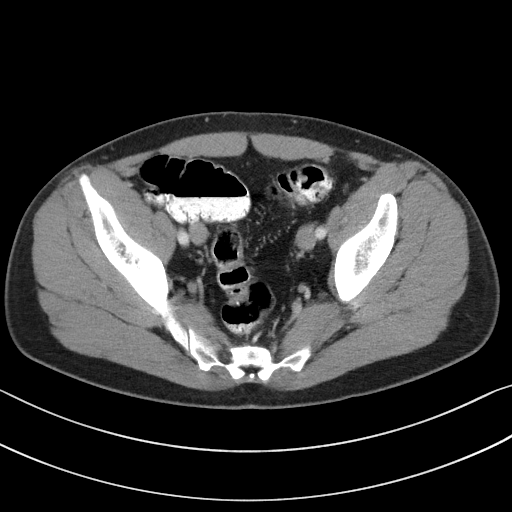
[im 32/94  soft-tissue]
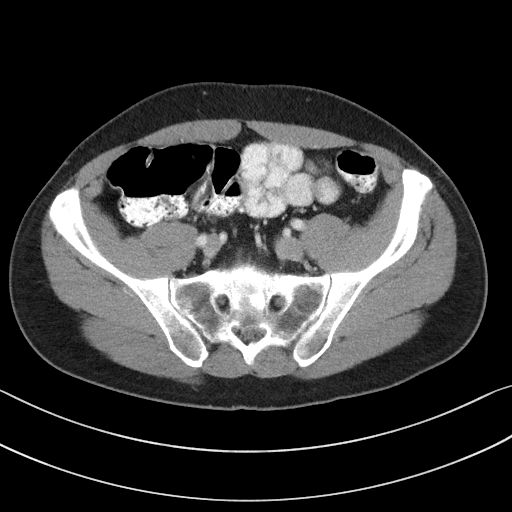
[im 38/94  soft-tissue]
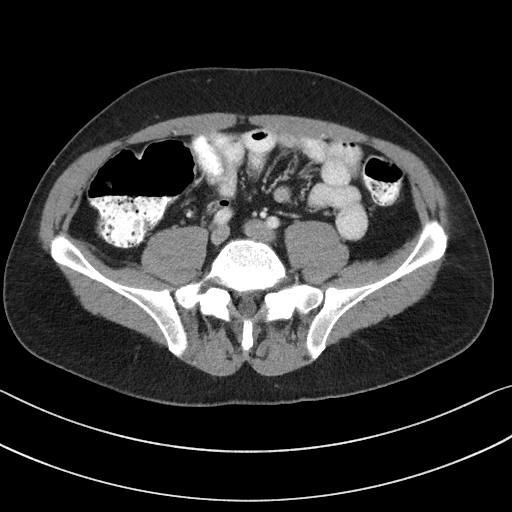
[im 50/94  soft-tissue]
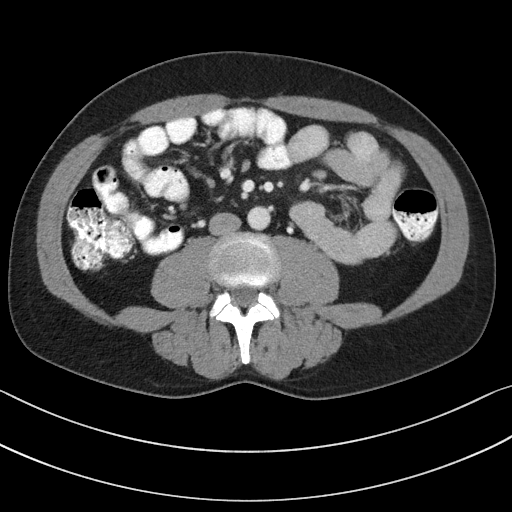
[im 56/94  soft-tissue]
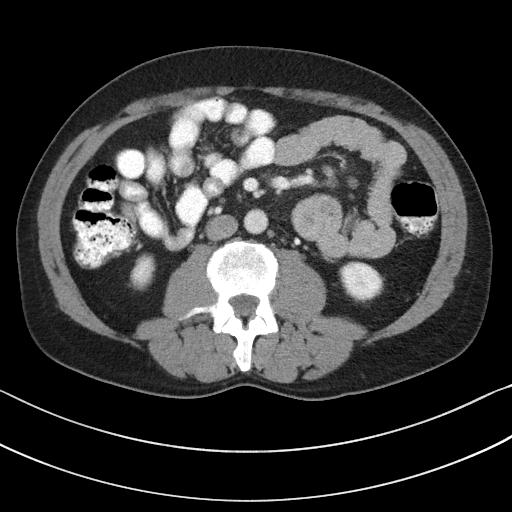
[im 63/94  soft-tissue]
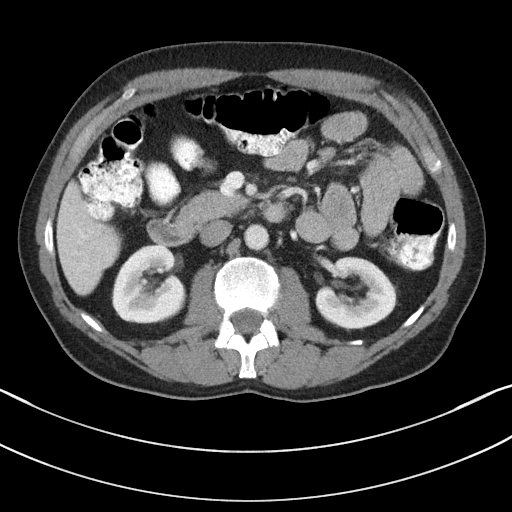
[im 63/94  bone]
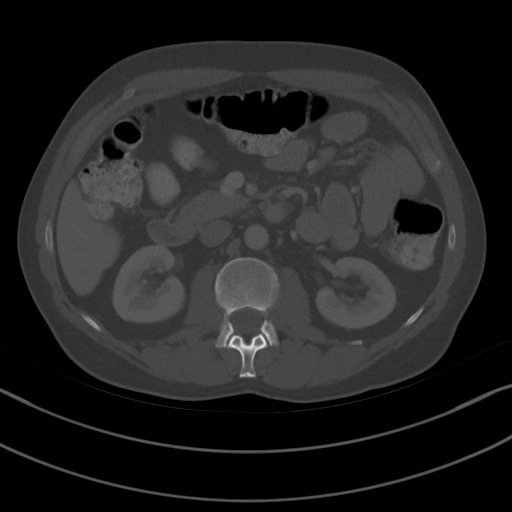
[im 69/94  soft-tissue]
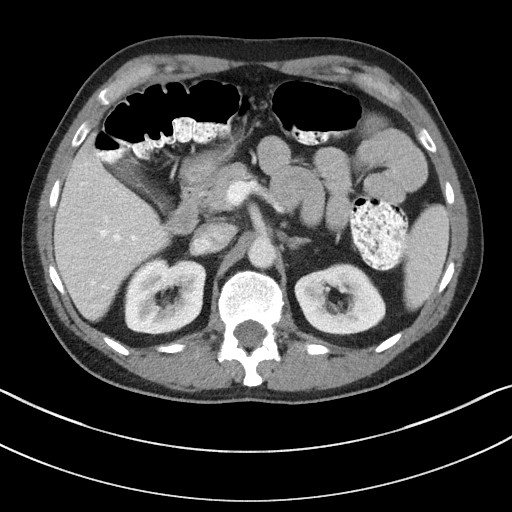
[im 75/94  soft-tissue]
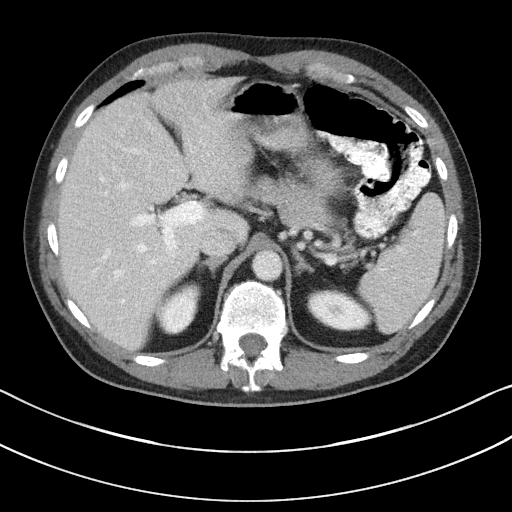
[im 81/94  soft-tissue]
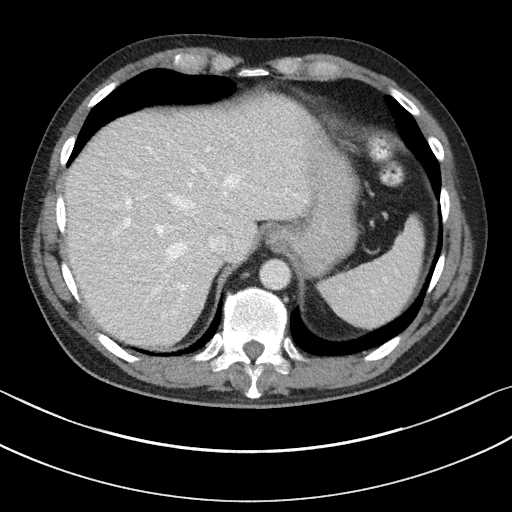
[im 87/94  soft-tissue]
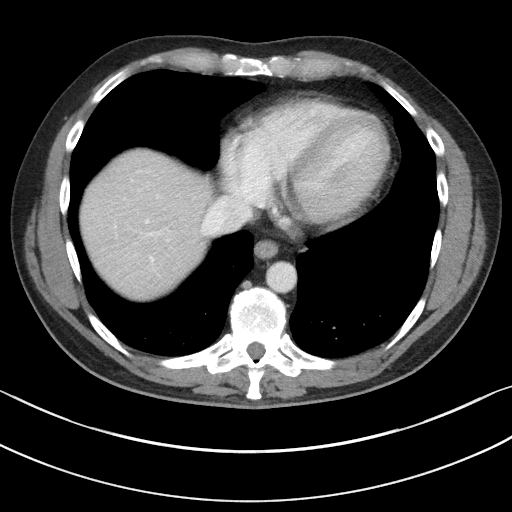

[Series 5: coronal st · coronal · 0.79mm/px · 3 of 88 slices shown]
[im 30/88  soft-tissue]
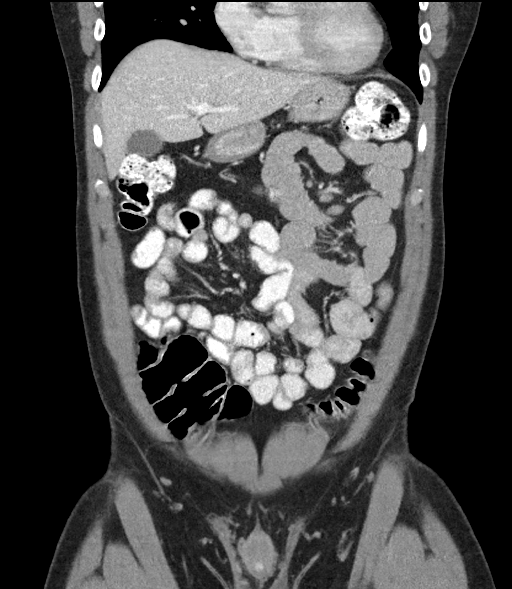
[im 39/88  soft-tissue]
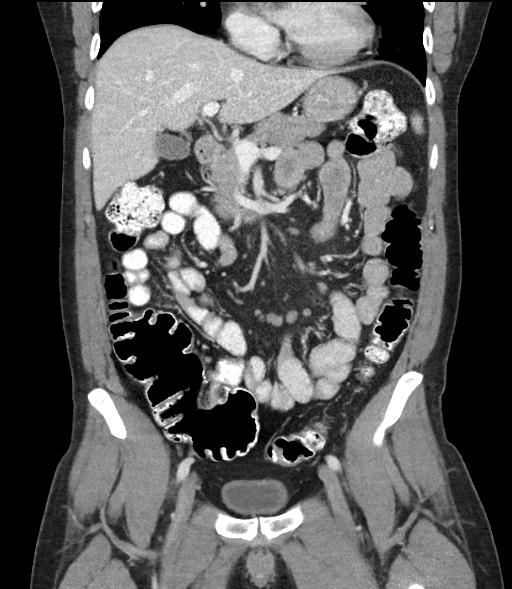
[im 49/88  soft-tissue]
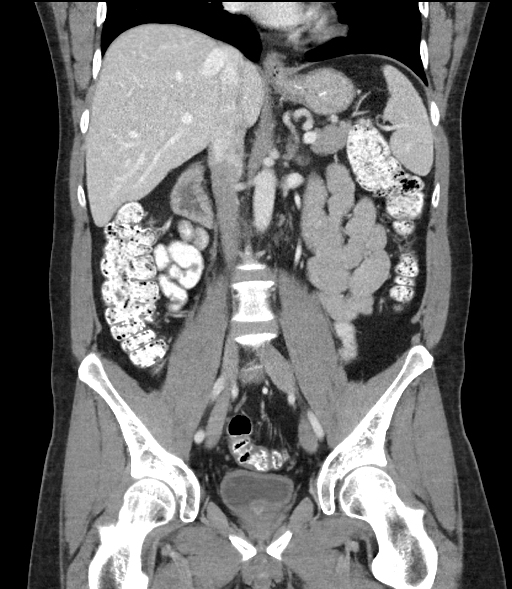

[16 of 46 positions shown; findings below may reference images not displayed]

FINDINGS: Lower chest: Lung bases are clear except for atelectatic change in
the inferior lingula.

Hepatobiliary: No focal liver lesions are appreciable. Gallbladder
wall is not appreciably thickened. There is no biliary duct
dilatation.

Pancreas: There is no pancreatic mass or inflammatory focus.

Spleen: No splenic lesions are evident.

Adrenals/Urinary Tract: Adrenals appear unremarkable bilaterally.
Kidneys bilaterally show no evident mass or hydronephrosis on either
side. There is no evident renal or ureteral calculus on either side.
Urinary bladder is midline with wall thickness upper normal for
degree of distention.

Stomach/Bowel: Most small bowel loops are fluid filled. There is no
appreciable bowel wall thickening or mesenteric thickening. There is
no appreciable bowel dilatation. No bowel obstruction is
demonstrable on this study. There is no free air or portal venous
air.

Vascular/Lymphatic: There is no abdominal aortic aneurysm. No
vascular lesions are appreciable. There is no evident adenopathy in
the abdomen or pelvis.

Reproductive: Prostate and seminal vesicles are normal in size and
contour. No evident pelvic mass.

Other: The appendix appears normal. There is no abscess or ascites
in the abdomen or pelvis. There is a small ventral hernia containing
only fat.

Musculoskeletal: There is degenerative change in the lumbar spine,
most notably at L5-S1. There are no blastic or lytic bone lesions.
There is no intramuscular lesion evident.
IMPRESSION: 1. Most small bowel loops are fluid-filled. Suspect a degree of
enteritis or early ileus. No bowel obstruction.

2. Appendix appears normal. No abscess evident in the abdomen or
pelvis.

3.  No evident renal or ureteral calculus.  No hydronephrosis.

4. Urinary bladder wall thickness is upper normal. It may be prudent
to correlate with urinalysis to exclude possible early cystitis.

5.  Small ventral hernia containing fat but no bowel.
